# Patient Record
Sex: Male | Born: 1995 | Race: White | Hispanic: No | Marital: Married | Smoking: Former smoker
Health system: Southern US, Community
[De-identification: ages and names within clinical notes are randomized; demographics above are authoritative.]

## PROBLEM LIST (undated history)

## (undated) DIAGNOSIS — E119 Type 2 diabetes mellitus without complications: Secondary | ICD-10-CM

---
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
## 2019-02-27 ENCOUNTER — Emergency Department (HOSPITAL_COMMUNITY): Payer: BC Managed Care – PPO

## 2019-02-27 ENCOUNTER — Encounter (HOSPITAL_COMMUNITY): Payer: Self-pay | Admitting: Emergency Medicine

## 2019-02-27 ENCOUNTER — Other Ambulatory Visit: Payer: Self-pay

## 2019-02-27 ENCOUNTER — Emergency Department (HOSPITAL_COMMUNITY)
Admission: EM | Admit: 2019-02-27 | Discharge: 2019-02-27 | Disposition: A | Discharge status: Home / Self Care | Payer: BC Managed Care – PPO | Attending: Emergency Medicine | Admitting: Emergency Medicine

## 2019-02-27 DIAGNOSIS — N390 Urinary tract infection, site not specified: Secondary | ICD-10-CM | POA: Diagnosis not present

## 2019-02-27 DIAGNOSIS — R1032 Left lower quadrant pain: Secondary | ICD-10-CM | POA: Insufficient documentation

## 2019-02-27 DIAGNOSIS — R109 Unspecified abdominal pain: Secondary | ICD-10-CM

## 2019-02-27 DIAGNOSIS — Z87891 Personal history of nicotine dependence: Secondary | ICD-10-CM | POA: Diagnosis not present

## 2019-02-27 LAB — URINALYSIS, ROUTINE W REFLEX MICROSCOPIC
Bilirubin Urine: NEGATIVE
Glucose, UA: >=500 mg/dL — AB
Hgb urine dipstick: NEGATIVE
Ketones, ur: NEGATIVE mg/dL
Leukocytes,Ua: NEGATIVE
Nitrite: NEGATIVE
Protein, ur: NEGATIVE mg/dL
Specific Gravity, Urine: 1.022 (ref 1.005–1.030)
pH: 5 (ref 5.0–8.0)

## 2019-02-27 LAB — CBG MONITORING, ED: Glucose-Capillary: 237 mg/dL — ABNORMAL HIGH (ref 70–99)

## 2019-02-27 MED ORDER — AZITHROMYCIN 250 MG PO TABS
1000.0000 mg | ORAL_TABLET | Freq: Once | ORAL | Status: AC
  Administered 2019-02-27: 16:00:00 1000 mg via ORAL
  Filled 2019-02-27: qty 4

## 2019-02-27 MED ORDER — CEFTRIAXONE SODIUM 250 MG IJ SOLR
250.0000 mg | Freq: Once | INTRAMUSCULAR | Status: AC
  Administered 2019-02-27: 250 mg via INTRAMUSCULAR
  Filled 2019-02-27: qty 250

## 2019-02-27 NOTE — ED Triage Notes (Signed)
Pt reports having some vague urinary burning a few days ago followed by left flank pain which increased today with nausea. Took some Excedrin with some relief.

## 2019-02-27 NOTE — ED Provider Notes (Signed)
[facility] EMERGENCY DEPARTMENT Provider Note   CSN: [id number] Arrival date & time: 02/27/19  1310     History   Chief Complaint Chief Complaint  Patient presents with   Flank Pain    HPI Deavion Dobbs is a 23 y.o. male.     HPI   Lynard Postlewait is a 23 y.o. male who presents to the Emergency Department complaining of urinary pressure post void for 4 days.  2 days ago, he noticed pain to his left flank area for most of the day.  The following day the pain had resolved.  He continues to have urinary pressure.  He took some over-the-counter pain relievers with some improvement.  Today, he states while driving the flank pain became much worse and is now associated with nausea. He also endorses urinary frequency for 2-3 days.  Pain is been constant since onset today.  No history of kidney stones, no hematuria.  He denies vomiting or abdominal pain. He is sexually active, denies new sexual partners, penile discharge, or pain to his testicles.    History reviewed. No pertinent past medical history.  There are no active problems to display for this patient.   History reviewed. No pertinent surgical history.    Home Medications    Prior to Admission medications   Not on File    Family History History reviewed. No pertinent family history.  Social History Social History   Tobacco Use   Smoking status: Former Smoker   Smokeless tobacco: Never Used  Substance Use Topics   Alcohol use: Yes    Comment: several times weekly   Drug use: Never     Allergies   Patient has no known allergies.   Review of Systems Review of Systems  Constitutional: Negative for activity change, appetite change, chills and fever.  Respiratory: Negative for chest tightness and shortness of breath.   Cardiovascular: Negative for chest pain.  Gastrointestinal: Positive for nausea. Negative for abdominal pain and vomiting.  Genitourinary: Positive for flank pain, frequency and urgency.  Negative for decreased urine volume, difficulty urinating, discharge, dysuria, hematuria, penile swelling, scrotal swelling and testicular pain.  Musculoskeletal: Negative for back pain.  Skin: Negative for rash.  Neurological: Negative for dizziness, weakness and numbness.  Hematological: Negative for adenopathy.  Psychiatric/Behavioral: Negative for confusion.     Physical Exam Updated Vital Signs BP (!) 153/92 (BP Location: Right Arm)    Pulse 78    Temp 98 F (36.7 C) (Oral)    Resp 16    Ht 5\' 11"  (1.803 m)    Wt 120.2 kg    SpO2 99%    BMI 36.96 kg/m   Physical Exam Vitals signs and nursing note reviewed.  Constitutional:      General: He is not in acute distress.    Appearance: Normal appearance. He is not ill-appearing.  HENT:     Mouth/Throat:     Mouth: Mucous membranes are moist.  Cardiovascular:     Rate and Rhythm: Normal rate and regular rhythm.     Pulses: Normal pulses.  Pulmonary:     Effort: Pulmonary effort is normal.     Breath sounds: Normal breath sounds.  Abdominal:     Palpations: Abdomen is soft. There is no mass.     Tenderness: There is no abdominal tenderness. There is no right CVA tenderness, left CVA tenderness or guarding.  Musculoskeletal: Normal range of motion.        General: No tenderness.  Skin:  General: Skin is warm.     Findings: No rash.  Neurological:     General: No focal deficit present.     Mental Status: He is alert.     Sensory: No sensory deficit.     Motor: No weakness.      ED Treatments / Results  Labs (all labs ordered are listed, but only abnormal results are displayed) Labs Reviewed  URINALYSIS, ROUTINE W REFLEX MICROSCOPIC - Abnormal; Notable for the following components:      Result Value   Color, Urine STRAW (*)    Glucose, UA >=500 (*)    Bacteria, UA RARE (*)    All other components within normal limits  CBG MONITORING, ED - Abnormal; Notable for the following components:   Glucose-Capillary 237 (*)     All other components within normal limits  URINE CULTURE  GC/CHLAMYDIA PROBE AMP ([place]) NOT AT [facility]    EKG None  Radiology Ct Renal Stone Study  Result Date: 02/27/2019 CLINICAL DATA:  Pt reports having some vague urinary burning a few days ago followed by left flank pain which increased today with nausea. Took some Excedrin with some relief. STANDARD FULL STONE PROTOCOL USED EXAM: CT ABDOMEN AND PELVIS WITHOUT CONTRAST TECHNIQUE: Multidetector CT imaging of the abdomen and pelvis was performed following the standard protocol without IV contrast. COMPARISON:  None. FINDINGS: Lower chest: No acute abnormality. Evaluation of the abdominal organs is somewhat limited by the lack of IV contrast. Hepatobiliary: The liver enlarged with diffuse fatty infiltration. No focal liver abnormality is seen. No gallstones, gallbladder wall thickening, or biliary dilatation. Pancreas: Unremarkable. No pancreatic ductal dilatation or surrounding inflammatory changes. Spleen: Normal in size without focal abnormality. Adrenals/Urinary Tract: Adrenal glands are unremarkable. 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 No renal calculi identified. No hydronephrosis. Bladder is unremarkable. Stomach/Bowel: Stomach is within normal limits. Appendix appears normal. No evidence of bowel wall thickening, distention, or inflammatory changes. Scattered colonic diverticula. Vascular/Lymphatic: No significant vascular findings are present. No enlarged abdominal or pelvic lymph nodes. Reproductive: Prostate is unremarkable. Other: No abdominal wall hernia or abnormality. No abdominopelvic ascites. Musculoskeletal: No acute or significant osseous findings. IMPRESSION: 1. No evidence of renal calculi or other CT finding to explain patient's abdominal pain. 2.  Enlarged liver with diffuse fatty infiltration. Electronically Signed   By: Emmaline KluverNancy  Ballantyne M.D.   On: 02/27/2019 15:26     Procedures Procedures (including critical care time)  Medications Ordered in ED Medications - No data to display   Initial Impression / Assessment and Plan / ED Course  I have reviewed the triage vital signs and the nursing notes.  Pertinent labs & imaging results that were available during my care of the patient were reviewed by me and considered in my medical decision making (see chart for details).       Patient with left flank pain and dysuria.  He is well-appearing and nontoxic.  Describes pain is minimal at present.  CT scan shows no evidence of renal calculi.  He does describe some dysuria and given age and the fact that he is sexually active, I will obtain urine, GC and chlamydia cultures and treat here with IM Rocephin and p.o. Zithromax.  He understands that he will be contacted and additional prescriptions will be written if urine cultures warrant  CBG here was 237.  Patient does admit to drinking a soda and eating fast food prior to arrival.  I have  recommended the patient contact 1 of the local providers to establish primary care as his blood sugar will need to be rechecked   Return precautions were discussed  Final Clinical Impressions(s) / ED Diagnoses   Final diagnoses:  Left flank pain  Urinary tract infection without hematuria, site unspecified    ED Discharge Orders    None       Pauline Ausriplett, Nayda Riesen, PA-C 02/27/19 1612    Bethann BerkshireZammit, Joseph, MD 02/27/19 1614

## 2019-02-27 NOTE — Discharge Instructions (Signed)
Drink plenty of water.  You will be contacted if any of your culture results are positive.  You may contact 1 of the providers listed to establish primary care.  Return to the ER for any worsening symptoms such as increasing pain, fever, or abdominal pain or vomiting.   [facility] Primary Care Doctor List    Sinda Du MD. Specialty: Pulmonary Disease Contact information: [place]  [place] [address]  [phone]   Tula Nakayama, MD. Specialty: [facility] Medicine Contact information: [address] [place] [address]  [phone]   Sallee Lange, MD. Specialty: [facility] Medicine Contact information: [place]  [place] [address]  [phone]   Rosita Fire, MD Specialty: Internal Medicine Contact information: [place] [address]  [phone]   Delphina Cahill, MD. Specialty: Internal Medicine Contact information: [place] [address]  [phone]    [facility] (Dr. Maudie Mercury) Specialty: Family Medicine Contact information: [place] [address]  [phone]   Leslie Andrea, MD. Specialty: [facility] Medicine Contact information: [place] [address]  [phone]   Asencion Noble, MD. Specialty: Internal Medicine Contact information: [address]  [place] [address]  [place]  [address] [place], [place] [address] [phone]  Services [facility] offers a variety of basic health services.  Services include but are not limited to: Blood pressure checks  Heart rate checks  Blood sugar checks  Urine analysis  Rapid strep tests  Pregnancy tests.  Health education and referrals  People needing more complex services will be directed to a physician online. Using these virtual visits, doctors can  evaluate and prescribe medicine and treatments. There will be no medication on-site, though [facility] will help patients fill their prescriptions at little to no cost.   For More information please go to: [email or web address]

## 2019-03-01 LAB — GC/CHLAMYDIA PROBE AMP (~~LOC~~) NOT AT ARMC
Chlamydia: NEGATIVE
Neisseria Gonorrhea: NEGATIVE

## 2021-01-12 IMAGING — CT CT RENAL STONE PROTOCOL
2 of 4 series · 17 of 46 positions shown, 19 images · non-contrast
Comparison: None.

CLINICAL DATA: Pt reports having some vague urinary burning a few
days ago followed by left flank pain which increased today with
nausea. Took some Excedrin with some relief. STANDARD FULL STONE
PROTOCOL USED

EXAM:
CT ABDOMEN AND PELVIS WITHOUT CONTRAST
TECHNIQUE: Multidetector CT imaging of the abdomen and pelvis was performed
following the standard protocol without IV contrast.

[Series 2: axial st · axial · 0.87mm/px · z∈[-364,+56]mm · 14 of 97 slices shown, 16 images]
[im 7/97  soft-tissue]
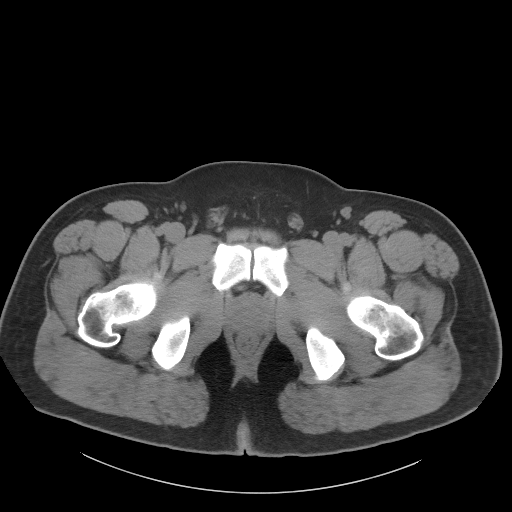
[im 7/97  bone]
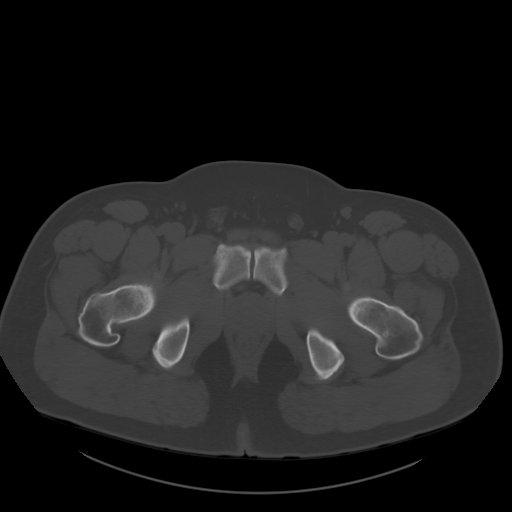
[im 13/97  soft-tissue]
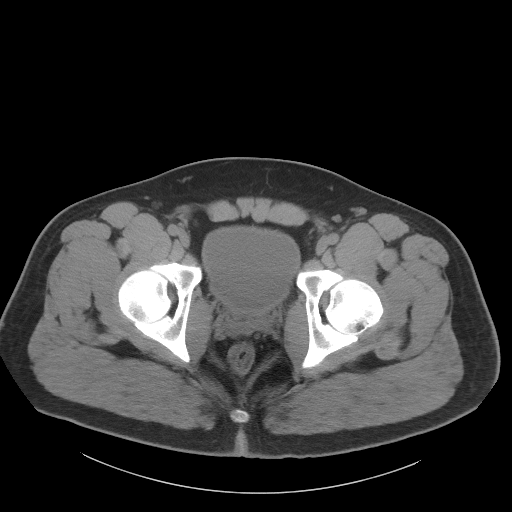
[im 19/97  soft-tissue]
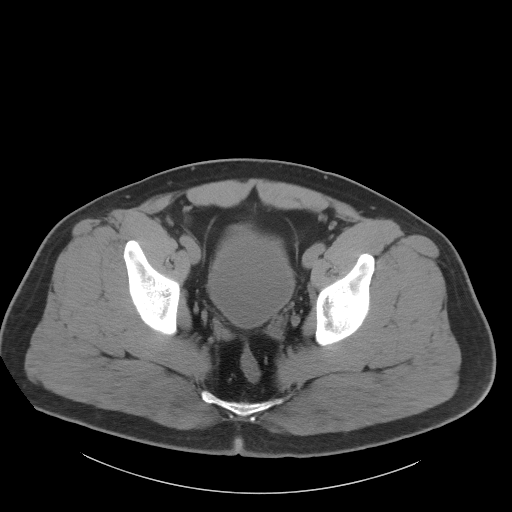
[im 25/97  soft-tissue]
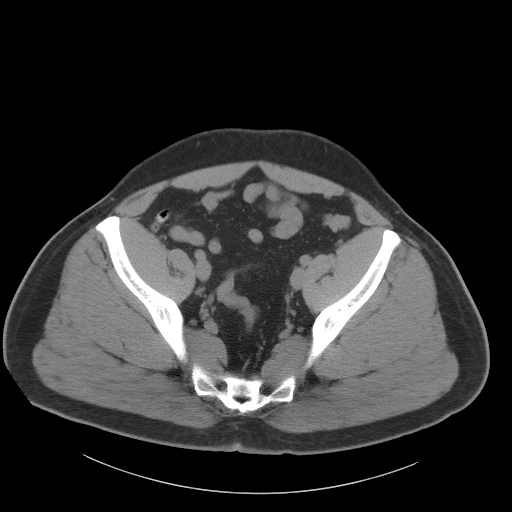
[im 31/97  soft-tissue]
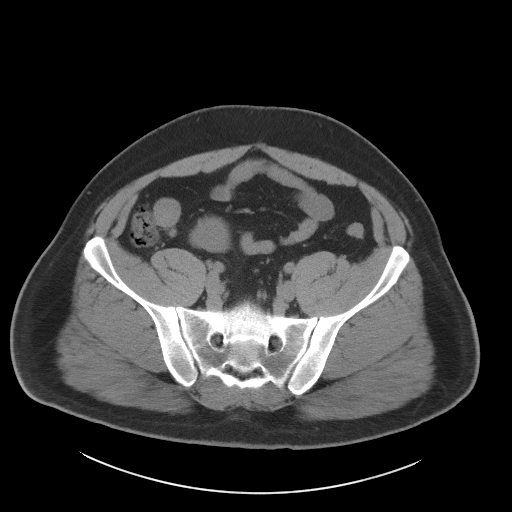
[im 37/97  soft-tissue]
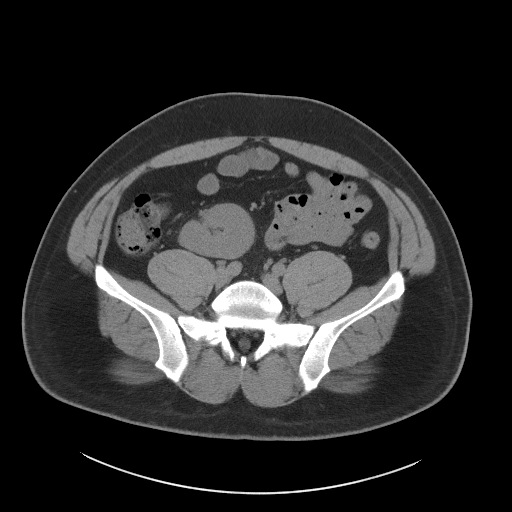
[im 43/97  soft-tissue]
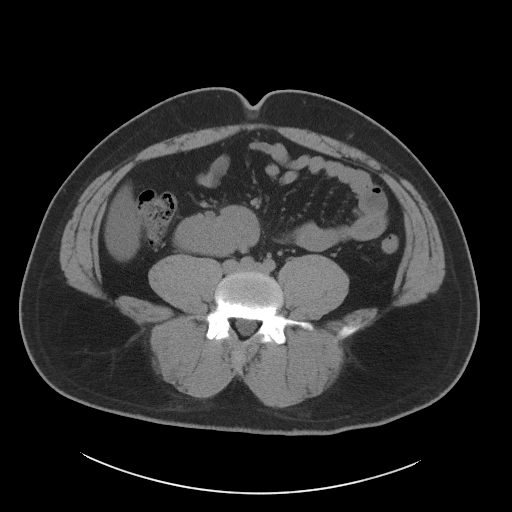
[im 55/97  soft-tissue]
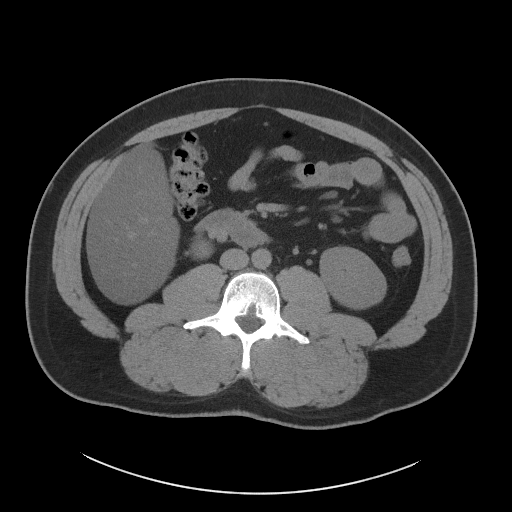
[im 61/97  soft-tissue]
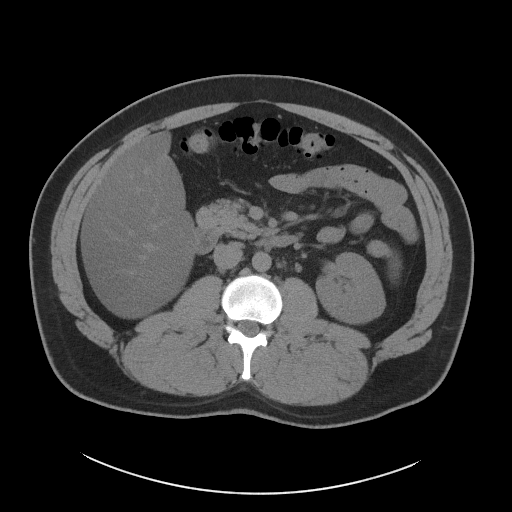
[im 61/97  bone]
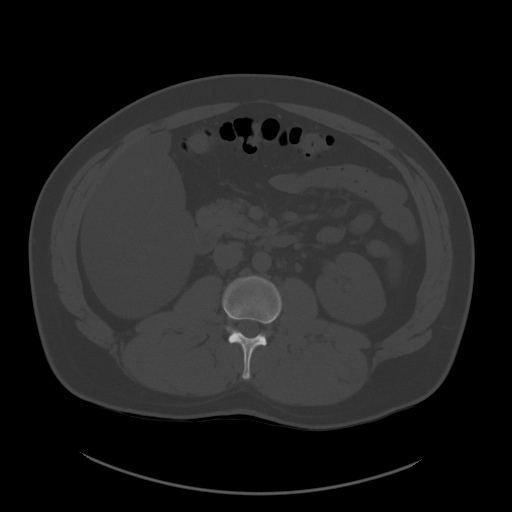
[im 67/97  soft-tissue]
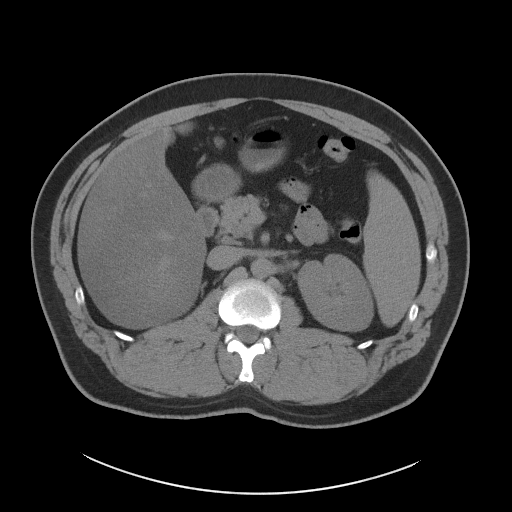
[im 73/97  soft-tissue]
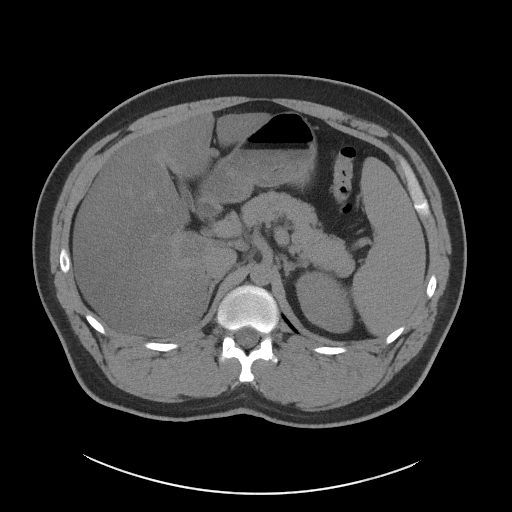
[im 79/97  soft-tissue]
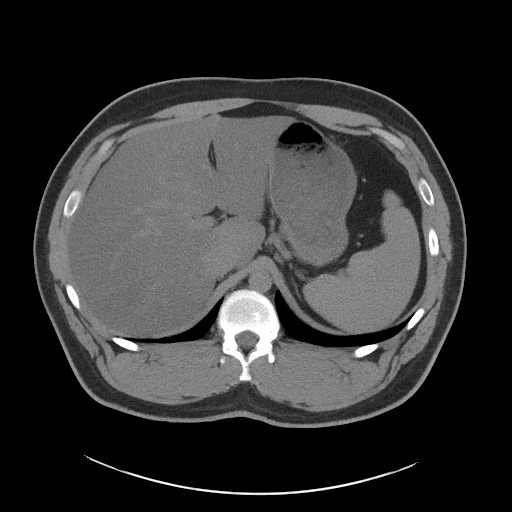
[im 85/97  soft-tissue]
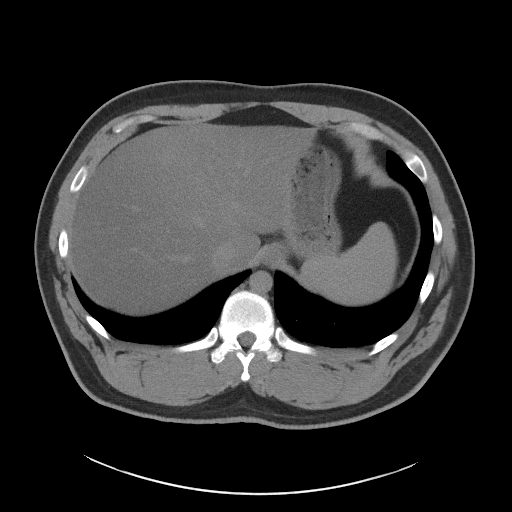
[im 91/97  soft-tissue]
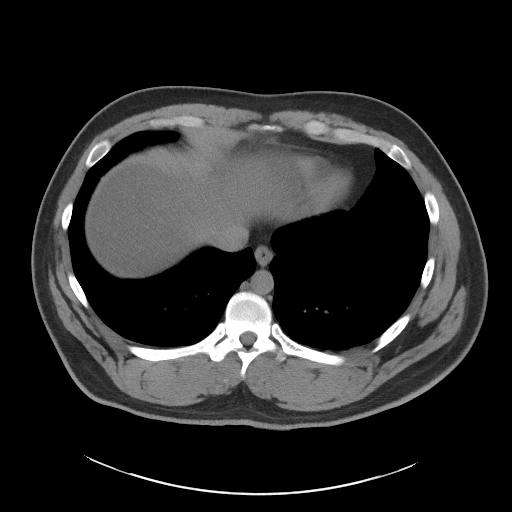

[Series 5: coronal st · coronal · 0.88mm/px · 3 of 116 slices shown]
[im 39/116  soft-tissue]
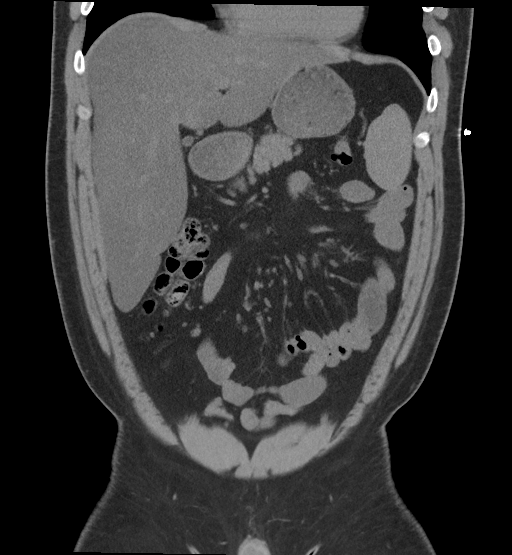
[im 52/116  soft-tissue]
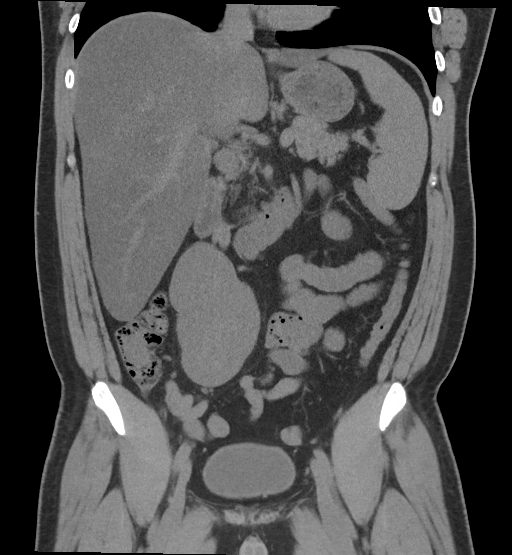
[im 64/116  soft-tissue]
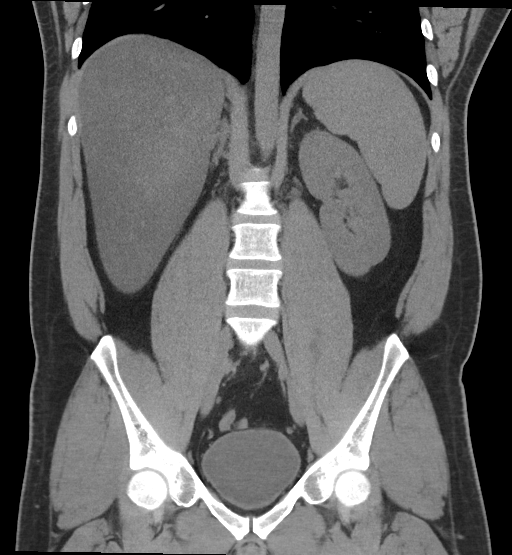

[17 of 46 positions shown; findings below may reference images not displayed]

FINDINGS: Lower chest: No acute abnormality.

Evaluation of the abdominal organs is somewhat limited by the lack
of IV contrast.

Hepatobiliary: The liver enlarged with diffuse fatty infiltration.
No focal liver abnormality is seen. No gallstones, gallbladder wall
thickening, or biliary dilatation.

Pancreas: Unremarkable. No pancreatic ductal dilatation or
surrounding inflammatory changes.

Spleen: Normal in size without focal abnormality.

Adrenals/Urinary Tract: Adrenal glands are unremarkable. Low lying
right kidney with likely partial duplication of the collecting
systems. No renal calculi identified. No hydronephrosis. Bladder is
unremarkable.

Stomach/Bowel: Stomach is within normal limits. Appendix appears
normal. No evidence of bowel wall thickening, distention, or
inflammatory changes. Scattered colonic diverticula.

Vascular/Lymphatic: No significant vascular findings are present. No
enlarged abdominal or pelvic lymph nodes.

Reproductive: Prostate is unremarkable.

Other: No abdominal wall hernia or abnormality. No abdominopelvic
ascites.

Musculoskeletal: No acute or significant osseous findings.
IMPRESSION: 1. No evidence of renal calculi or other CT finding to explain
patient's abdominal pain.

2.  Enlarged liver with diffuse fatty infiltration.

## 2022-04-16 ENCOUNTER — Ambulatory Visit (INDEPENDENT_AMBULATORY_CARE_PROVIDER_SITE_OTHER): Payer: BC Managed Care – PPO | Admitting: Family Medicine

## 2022-04-16 VITALS — BP 120/75 | HR 96 | Temp 97.5°F | Ht 71.0 in | Wt 224.0 lb

## 2022-04-16 DIAGNOSIS — Z1159 Encounter for screening for other viral diseases: Secondary | ICD-10-CM

## 2022-04-16 DIAGNOSIS — R739 Hyperglycemia, unspecified: Secondary | ICD-10-CM | POA: Diagnosis not present

## 2022-04-16 DIAGNOSIS — Z23 Encounter for immunization: Secondary | ICD-10-CM

## 2022-04-16 DIAGNOSIS — Z0000 Encounter for general adult medical examination without abnormal findings: Secondary | ICD-10-CM | POA: Insufficient documentation

## 2022-04-16 DIAGNOSIS — Z13220 Encounter for screening for lipoid disorders: Secondary | ICD-10-CM | POA: Diagnosis not present

## 2022-04-16 DIAGNOSIS — Z114 Encounter for screening for human immunodeficiency virus [HIV]: Secondary | ICD-10-CM

## 2022-04-16 DIAGNOSIS — Z130 Encounter for screening for diseases of the blood and blood-forming organs and certain disorders involving the immune mechanism: Secondary | ICD-10-CM | POA: Diagnosis not present

## 2022-04-16 DIAGNOSIS — Z8349 Family history of other endocrine, nutritional and metabolic diseases: Secondary | ICD-10-CM

## 2022-04-16 NOTE — Patient Instructions (Signed)
Labs today.  Regular exercise. Healthy diet.   Follow up annually or sooner if needed.  Take care  Dr. Lacinda Axon

## 2022-04-16 NOTE — Progress Notes (Signed)
Subjective:  Patient ID: [place] P[place], male    DOB: Nov 11, 1995  Age: 26 y.o. MRN: [id number]  CC: Chief Complaint  Patient presents with   Establish Care    No pcp since a teenager, no concerns    HPI:  26 year old male presents to establish care.  Patient states that he is doing well.  He has no particular complaints or concerns at this time.  He states that it is time to establish care with a primary doctor to set a good example for his family.  Former smoker.  Used to drink heavily as well.  He is given both of these up.  He has also lost a tremendous amount of weight.  He has lost over 100 pounds.  In 2020 he was seen in the ER.  This encounter was reviewed.  He was found to have glucose in his urine and also a capillary glucose of 237.  No formal diagnosis of diabetes.  Endorses a family history of cancer - Thyroid cancer (father), Mother (breast, cervical cancer).  Discussed preventative health care today.  He is amenable to flu vaccine.  Desires HIV and hepatitis C screening.  We will do labs today.  Advised that he can get the COVID-vaccine at his pharmacy if he desires.  Social Hx   Social History   Socioeconomic History   Marital status: Single    Spouse name: Not on file   Number of children: Not on file   Years of education: Not on file   Highest education level: Not on file  Occupational History   Not on file  Tobacco Use   Smoking status: Former   Smokeless tobacco: Never  Vaping Use   Vaping Use: Every day   Substances: Nicotine, Flavoring, Nicotine-salt  Substance and Sexual Activity   Alcohol use: Yes    Comment: several times weekly   Drug use: Never   Sexual activity: Not on file  Other Topics Concern   Not on file  Social History Narrative   Not on file   Social Determinants of Health   Financial Resource Strain: Not on file  Food Insecurity: Not on file  Transportation Needs: Not on file  Physical Activity: Not on file  Stress: Not on  file  Social Connections: Not on file    Review of Systems  Constitutional: Negative.   Respiratory: Negative.    Cardiovascular: Negative.    Objective:  BP 120/75   Pulse 96   Temp (!) 97.5 F (36.4 C)   Ht 5\' 11"  (1.803 m)   Wt 224 lb (101.6 kg)   SpO2 96%   BMI 31.24 kg/m      04/16/2022    1:05 PM 02/27/2019    4:03 PM 02/27/2019    1:17 PM  BP/Weight  Systolic BP 120 141 153  Diastolic BP 75 97 92  Wt. (Lbs) 224  265  BMI 31.24 kg/m2  36.96 kg/m2    Physical Exam Vitals and nursing note reviewed.  Constitutional:      General: He is not in acute distress.    Appearance: Normal appearance.  HENT:     Head: Normocephalic and atraumatic.     Right Ear: Tympanic membrane normal.     Left Ear: Tympanic membrane normal.     Mouth/Throat:     Pharynx: Oropharynx is clear. No oropharyngeal exudate.  Eyes:     General:        Right eye: No discharge.  Left eye: No discharge.     Conjunctiva/sclera: Conjunctivae normal.  Cardiovascular:     Rate and Rhythm: Normal rate and regular rhythm.  Pulmonary:     Effort: Pulmonary effort is normal.     Breath sounds: Normal breath sounds. No wheezing, rhonchi or rales.  Musculoskeletal:     Cervical back: Neck supple.  Lymphadenopathy:     Cervical: No cervical adenopathy.  Skin:    General: Skin is warm.     Findings: No rash.  Neurological:     General: No focal deficit present.     Mental Status: He is alert.  Psychiatric:        Mood and Affect: Mood normal.        Behavior: Behavior normal.      Assessment & Plan:   Problem List Items Addressed This Visit       Other   Annual physical exam - Primary    Laboratory noticed today including HIV and hepatitis C. Flu vaccine given.  Advised to get COVID-vaccine in his pharmacy if he desires. Advised healthy diet and regular exercise. Awaiting laboratory studies.      Other Visit Diagnoses     Screening for deficiency anemia       Relevant  Orders   CBC   Blood glucose elevated       Relevant Orders   CMP14+EGFR   Hemoglobin A1c   Screening, lipid       Relevant Orders   Lipid panel   Family history of thyroid disease       Relevant Orders   TSH   Need for hepatitis C screening test       Relevant Orders   Hepatitis C Antibody   Screening for HIV (human immunodeficiency virus)       Relevant Orders   HIV antibody (with reflex)      Follow-up:  Annually; sooner if abnormal lab results.  [place]

## 2022-04-16 NOTE — Assessment & Plan Note (Signed)
Laboratory noticed today including HIV and hepatitis C. Flu vaccine given.  Advised to get COVID-vaccine in his pharmacy if he desires. Advised healthy diet and regular exercise. Awaiting laboratory studies.

## 2022-04-17 LAB — CMP14+EGFR
ALT: 58 IU/L — ABNORMAL HIGH (ref 0–44)
AST: 26 IU/L (ref 0–40)
Albumin/Globulin Ratio: 2 (ref 1.2–2.2)
Albumin: 4.9 g/dL (ref 4.3–5.2)
Alkaline Phosphatase: 96 IU/L (ref 44–121)
BUN/Creatinine Ratio: 11 (ref 9–20)
BUN: 9 mg/dL (ref 6–20)
Bilirubin Total: 0.6 mg/dL (ref 0.0–1.2)
CO2: 23 mmol/L (ref 20–29)
Calcium: 9.9 mg/dL (ref 8.7–10.2)
Chloride: 97 mmol/L (ref 96–106)
Creatinine, Ser: 0.84 mg/dL (ref 0.76–1.27)
Globulin, Total: 2.5 g/dL (ref 1.5–4.5)
Glucose: 392 mg/dL — ABNORMAL HIGH (ref 70–99)
Potassium: 4.6 mmol/L (ref 3.5–5.2)
Sodium: 136 mmol/L (ref 134–144)
Total Protein: 7.4 g/dL (ref 6.0–8.5)
eGFR: 122 mL/min/{1.73_m2} (ref >59)

## 2022-04-17 LAB — HIV ANTIBODY (ROUTINE TESTING W REFLEX): HIV Screen 4th Generation wRfx: NONREACTIVE

## 2022-04-17 LAB — LIPID PANEL
Chol/HDL Ratio: 3.7 ratio (ref 0.0–5.0)
Cholesterol, Total: 180 mg/dL (ref 100–199)
HDL: 49 mg/dL (ref >39)
LDL Chol Calc (NIH): 111 mg/dL — ABNORMAL HIGH (ref 0–99)
Triglycerides: 111 mg/dL (ref 0–149)
VLDL Cholesterol Cal: 20 mg/dL (ref 5–40)

## 2022-04-17 LAB — CBC
Hematocrit: 46.8 % (ref 37.5–51.0)
Hemoglobin: 16.4 g/dL (ref 13.0–17.7)
MCH: 33 pg (ref 26.6–33.0)
MCHC: 35 g/dL (ref 31.5–35.7)
MCV: 94 fL (ref 79–97)
Platelets: 343 10*3/uL (ref 150–450)
RBC: 4.97 x10E6/uL (ref 4.14–5.80)
RDW: 12 % (ref 11.6–15.4)
WBC: 7 10*3/uL (ref 3.4–10.8)

## 2022-04-17 LAB — TSH: TSH: 1.01 u[IU]/mL (ref 0.450–4.500)

## 2022-04-17 LAB — HEPATITIS C ANTIBODY: Hep C Virus Ab: NONREACTIVE

## 2022-04-17 LAB — HEMOGLOBIN A1C
Est. average glucose Bld gHb Est-mCnc: 240 mg/dL
Hgb A1c MFr Bld: 10 % — ABNORMAL HIGH (ref 4.8–5.6)

## 2022-04-20 ENCOUNTER — Other Ambulatory Visit: Payer: Self-pay | Admitting: Family Medicine

## 2022-04-20 MED ORDER — BLOOD GLUCOSE MONITOR KIT: PACK | 0 refills | Status: DC

## 2022-04-20 MED ORDER — METFORMIN HCL 500 MG PO TABS: 500.0000 mg | ORAL_TABLET | Freq: Two times a day (BID) | ORAL | 3 refills | Status: DC

## 2022-04-27 ENCOUNTER — Other Ambulatory Visit: Payer: Self-pay | Admitting: Family Medicine

## 2022-04-27 MED ORDER — ACCU-CHEK SOFTCLIX LANCETS MISC: 12 refills | Status: DC

## 2022-04-27 MED ORDER — ACCU-CHEK GUIDE VI STRP: ORAL_STRIP | 12 refills | Status: DC

## 2023-03-16 ENCOUNTER — Encounter: Payer: Self-pay | Admitting: Family Medicine

## 2023-03-16 ENCOUNTER — Ambulatory Visit (INDEPENDENT_AMBULATORY_CARE_PROVIDER_SITE_OTHER): Payer: BC Managed Care – PPO | Admitting: Family Medicine

## 2023-03-16 VITALS — BP 113/74 | HR 71 | Temp 98.1°F | Wt 223.4 lb

## 2023-03-16 DIAGNOSIS — R070 Pain in throat: Secondary | ICD-10-CM

## 2023-03-16 DIAGNOSIS — E7800 Pure hypercholesterolemia, unspecified: Secondary | ICD-10-CM

## 2023-03-16 DIAGNOSIS — E1165 Type 2 diabetes mellitus with hyperglycemia: Secondary | ICD-10-CM | POA: Diagnosis not present

## 2023-03-16 DIAGNOSIS — E785 Hyperlipidemia, unspecified: Secondary | ICD-10-CM | POA: Insufficient documentation

## 2023-03-16 NOTE — Patient Instructions (Signed)
Referring to ENT.  Labs and urine today.

## 2023-03-16 NOTE — Progress Notes (Signed)
Subjective:  Patient ID: Steven Santiago, male    DOB: 1995/09/10  Age: 27 y.o. MRN: [id number]  CC: Throat issue   HPI:  27 year old male presents for evaluation of the above.  Patient reports a 60-month history of an odd sensation in his throat.  He states that he feels like it catches or grinds.  No significant pain or discomfort when swallowing although if he leans forward with his neck flexed he can have some difficulty swallowing.  No difficulty breathing.  No hoarseness.  No other associated symptoms.  Patient would like recheck of A1c.  Previously patient found to have uncontrolled diabetes with an A1c of 10.  He has made lifestyle changes.  I prescribed metformin but he has not taken this medication.  Patient Active Problem List   Diagnosis Date Noted   Uncontrolled type 2 diabetes mellitus with hyperglycemia (HCC) 03/16/2023   Hyperlipidemia 03/16/2023   Throat discomfort 03/16/2023   Annual physical exam 04/16/2022    Social Hx   Social History   Socioeconomic History   Marital status: Single    Spouse name: Not on file   Number of children: Not on file   Years of education: Not on file   Highest education level: Not on file  Occupational History   Not on file  Tobacco Use   Smoking status: Former   Smokeless tobacco: Never  Vaping Use   Vaping status: Every Day   Substances: Nicotine, Flavoring, Nicotine-salt  Substance and Sexual Activity   Alcohol use: Yes    Comment: several times weekly   Drug use: Never   Sexual activity: Not on file  Other Topics Concern   Not on file  Social History Narrative   Not on file   Social Determinants of Health   Financial Resource Strain: Not on file  Food Insecurity: Not on file  Transportation Needs: Not on file  Physical Activity: Not on file  Stress: Not on file  Social Connections: Not on file    Review of Systems Per HPI  Objective:  BP 113/74   Pulse 71   Temp 98.1 F (36.7 C) (Oral)   Wt 223 lb  6.4 oz (101.3 kg)   SpO2 99%   BMI 31.16 kg/m      03/16/2023    2:59 PM 04/16/2022    1:05 PM 02/27/2019    4:03 PM  BP/Weight  Systolic BP 113 120 141  Diastolic BP 74 75 97  Wt. (Lbs) 223.4 224   BMI 31.16 kg/m2 31.24 kg/m2     Physical Exam Vitals and nursing note reviewed.  HENT:     Head: Normocephalic and atraumatic.     Mouth/Throat:     Comments: Oropharynx -cobblestoning noted. Cardiovascular:     Rate and Rhythm: Normal rate and regular rhythm.  Pulmonary:     Effort: Pulmonary effort is normal.     Breath sounds: Normal breath sounds. No wheezing or rales.  Musculoskeletal:     Cervical back: Neck supple. No tenderness.  Lymphadenopathy:     Cervical: No cervical adenopathy.  Neurological:     Mental Status: He is alert.  Psychiatric:        Mood and Affect: Mood normal.        Behavior: Behavior normal.     Lab Results  Component Value Date   WBC 7.0 04/16/2022   HGB 16.4 04/16/2022   HCT 46.8 04/16/2022   PLT 343 04/16/2022   GLUCOSE 392 (H) 04/16/2022  CHOL 180 04/16/2022   TRIG 111 04/16/2022   HDL 49 04/16/2022   LDLCALC 111 (H) 04/16/2022   ALT 58 (H) 04/16/2022   AST 26 04/16/2022   NA 136 04/16/2022   K 4.6 04/16/2022   CL 97 04/16/2022   CREATININE 0.84 04/16/2022   BUN 9 04/16/2022   CO2 23 04/16/2022   TSH 1.010 04/16/2022   HGBA1C 10.0 (H) 04/16/2022     Assessment & Plan:   Problem List Items Addressed This Visit       Endocrine   Uncontrolled type 2 diabetes mellitus with hyperglycemia (HCC)    Unsure of current control.  Labs today for further evaluation.      Relevant Orders   Hemoglobin A1c   Microalbumin / creatinine urine ratio     Other   Throat discomfort - Primary    Etiology unclear.  Referring to ENT.      Relevant Orders   Ambulatory referral to ENT   Hyperlipidemia   Relevant Orders   Lipid panel     Follow-up:  Has follow up scheduled.  Everlene Other DO [facility]

## 2023-03-16 NOTE — Assessment & Plan Note (Signed)
Unsure of current control.  Labs today for further evaluation.

## 2023-03-16 NOTE — Assessment & Plan Note (Signed)
Etiology unclear.  Referring to ENT.

## 2023-03-17 LAB — LIPID PANEL
Chol/HDL Ratio: 4.3 ratio (ref 0.0–5.0)
Cholesterol, Total: 203 mg/dL — ABNORMAL HIGH (ref 100–199)
HDL: 47 mg/dL (ref >39)
LDL Chol Calc (NIH): 128 mg/dL — ABNORMAL HIGH (ref 0–99)
Triglycerides: 158 mg/dL — ABNORMAL HIGH (ref 0–149)
VLDL Cholesterol Cal: 28 mg/dL (ref 5–40)

## 2023-03-17 LAB — HEMOGLOBIN A1C
Est. average glucose Bld gHb Est-mCnc: 312 mg/dL
Hgb A1c MFr Bld: 12.5 % — ABNORMAL HIGH (ref 4.8–5.6)

## 2023-03-17 LAB — MICROALBUMIN / CREATININE URINE RATIO
Creatinine, Urine: 28.8 mg/dL
Microalb/Creat Ratio: <10 mg/g{creat} (ref 0–29)
Microalbumin, Urine: <3 ug/mL

## 2023-03-18 ENCOUNTER — Other Ambulatory Visit: Payer: Self-pay

## 2023-03-18 DIAGNOSIS — E1165 Type 2 diabetes mellitus with hyperglycemia: Secondary | ICD-10-CM

## 2023-03-24 ENCOUNTER — Telehealth: Payer: Self-pay

## 2023-03-24 NOTE — Telephone Encounter (Signed)
Please send GLP1 to pharmacy

## 2023-03-25 ENCOUNTER — Other Ambulatory Visit: Payer: Self-pay | Admitting: Family Medicine

## 2023-03-25 MED ORDER — SEMAGLUTIDE(0.25 OR 0.5MG/DOS) 2 MG/3ML ~~LOC~~ SOPN: PEN_INJECTOR | SUBCUTANEOUS | 0 refills | Status: DC

## 2023-04-09 ENCOUNTER — Encounter: Payer: Self-pay | Admitting: *Deleted

## 2023-04-09 ENCOUNTER — Telehealth: Payer: Self-pay | Admitting: Pharmacist

## 2023-04-09 NOTE — Telephone Encounter (Signed)
A1C 12.5% New to practice LDL uncontrolled Will likely need titration

## 2023-04-09 NOTE — Telephone Encounter (Signed)
APPROVED-Ozempic Please let patient and pharmacy know Patient should go online to get $25 copay card (can do on your phone)

## 2023-04-12 NOTE — Telephone Encounter (Signed)
Patient notified via mychart

## 2023-04-12 NOTE — Telephone Encounter (Signed)
Coral Spikes, DO     Please inform patient.

## 2023-04-26 ENCOUNTER — Ambulatory Visit (INDEPENDENT_AMBULATORY_CARE_PROVIDER_SITE_OTHER): Payer: Commercial Managed Care - PPO | Admitting: Family Medicine

## 2023-04-26 VITALS — BP 123/86 | HR 101 | Temp 97.9°F | Ht 69.69 in | Wt 223.4 lb

## 2023-04-26 DIAGNOSIS — Z809 Family history of malignant neoplasm, unspecified: Secondary | ICD-10-CM | POA: Diagnosis not present

## 2023-04-26 DIAGNOSIS — Z0000 Encounter for general adult medical examination without abnormal findings: Secondary | ICD-10-CM

## 2023-04-26 DIAGNOSIS — Z23 Encounter for immunization: Secondary | ICD-10-CM

## 2023-04-26 NOTE — Assessment & Plan Note (Signed)
Recommended continuation of GLP-1 therapy.  He is amenable to this.  Flu vaccine given. No labs today.  He has had fairly recent labs. Referring to genetic counseling given family history of cancer.  No indications for colonoscopy at this time.

## 2023-04-26 NOTE — Progress Notes (Signed)
Subjective:  Patient ID: Steven Santiago, male    DOB: 25-Mar-1996  Age: 27 y.o. MRN: [id number]  CC:  Annual exam   HPI:  27 year old male presents for a physical.  Patient reports that overall he is doing well.  He is having some side effects from semaglutide.  He is having some abdominal discomfort and nausea.  He is currently on the starting dose.  A1c 12.5 in September.  In regards to his preventative health care, he desires his flu vaccine today.  Patient inquiring about whether he needs a colonoscopy given stool changes and family history of cancer.  He states that he has an extensive family history of cancer including thyroid cancer in his father which was at a very young age (26), colon cancer in his grandmother, mother with breast cancer will discuss referral to genetic counseling today.  Patient Active Problem List   Diagnosis Date Noted   Uncontrolled type 2 diabetes mellitus with hyperglycemia (HCC) 03/16/2023   Hyperlipidemia 03/16/2023   Throat discomfort 03/16/2023   Annual physical exam 04/16/2022    Social Hx   Social History   Socioeconomic History   Marital status: Single    Spouse name: Not on file   Number of children: Not on file   Years of education: Not on file   Highest education level: Not on file  Occupational History   Not on file  Tobacco Use   Smoking status: Former   Smokeless tobacco: Never  Vaping Use   Vaping status: Every Day   Substances: Nicotine, Flavoring, Nicotine-salt  Substance and Sexual Activity   Alcohol use: Yes    Comment: several times weekly   Drug use: Never   Sexual activity: Not on file  Other Topics Concern   Not on file  Social History Narrative   Not on file   Social Determinants of Health   Financial Resource Strain: Not on file  Food Insecurity: Not on file  Transportation Needs: Not on file  Physical Activity: Not on file  Stress: Not on file  Social Connections: Not on file    Review of Systems Per  HPI  Objective:  BP 123/86   Pulse (!) 101   Temp 97.9 F (36.6 C)   Ht 5' 9.69" (1.77 m)   Wt 223 lb 6.4 oz (101.3 kg)   SpO2 99%   BMI 32.35 kg/m      04/26/2023    1:40 PM 03/16/2023    2:59 PM 04/16/2022    1:05 PM  BP/Weight  Systolic BP 123 113 120  Diastolic BP 86 74 75  Wt. (Lbs) 223.4 223.4 224  BMI 32.35 kg/m2 31.16 kg/m2 31.24 kg/m2    Physical Exam Vitals and nursing note reviewed.  Constitutional:      General: He is not in acute distress.    Appearance: Normal appearance.  HENT:     Head: Normocephalic and atraumatic.     Right Ear: Tympanic membrane normal.     Left Ear: Tympanic membrane normal.     Nose: Nose normal.     Mouth/Throat:     Pharynx: Oropharynx is clear.  Eyes:     General:        Right eye: No discharge.        Left eye: No discharge.     Conjunctiva/sclera: Conjunctivae normal.  Cardiovascular:     Rate and Rhythm: Normal rate and regular rhythm.  Pulmonary:     Effort: Pulmonary effort is normal.  Breath sounds: Normal breath sounds. No wheezing, rhonchi or rales.  Abdominal:     General: There is no distension.     Palpations: Abdomen is soft.     Tenderness: There is no abdominal tenderness.  Musculoskeletal:     Cervical back: Neck supple. No tenderness.  Neurological:     Mental Status: He is alert.  Psychiatric:        Mood and Affect: Mood normal.        Behavior: Behavior normal.     Lab Results  Component Value Date   WBC 7.0 04/16/2022   HGB 16.4 04/16/2022   HCT 46.8 04/16/2022   PLT 343 04/16/2022   GLUCOSE 392 (H) 04/16/2022   CHOL 203 (H) 03/16/2023   TRIG 158 (H) 03/16/2023   HDL 47 03/16/2023   LDLCALC 128 (H) 03/16/2023   ALT 58 (H) 04/16/2022   AST 26 04/16/2022   NA 136 04/16/2022   K 4.6 04/16/2022   CL 97 04/16/2022   CREATININE 0.84 04/16/2022   BUN 9 04/16/2022   CO2 23 04/16/2022   TSH 1.010 04/16/2022   HGBA1C 12.5 (H) 03/16/2023     Assessment & Plan:   Problem List  Items Addressed This Visit       Other   Annual physical exam - Primary    Recommended continuation of GLP-1 therapy.  He is amenable to this.  Flu vaccine given. No labs today.  He has had fairly recent labs. Referring to genetic counseling given family history of cancer.  No indications for colonoscopy at this time.      Other Visit Diagnoses     Immunization due       Relevant Orders   Flu vaccine trivalent PF, 6mos and older(Flulaval,Afluria,Fluarix,Fluzone) (Completed)   Family history of cancer       Relevant Orders   Ambulatory referral to Genetics      Follow-up:  Return in about 3 months (around 07/27/2023) for Diabetes follow up.  Everlene Other DO [facility]

## 2023-04-26 NOTE — Patient Instructions (Addendum)
Referral placed.  Continue your medication. If you feel that you are not tolerating well please let me know.  Follow up in 3 months.

## 2023-04-29 NOTE — Telephone Encounter (Signed)
Scheduled appointments per referral. Patient is aware of the appointment time and date as well as the address. Patient was informed to arrive 10-15 minutes prior with updated insurance information. All questions were answered.

## 2023-05-26 ENCOUNTER — Telehealth: Payer: Self-pay | Admitting: Genetic Counselor

## 2023-05-26 NOTE — Telephone Encounter (Signed)
Rescheduled appointment per patients request. Patient is aware of the changes made to his upcoming appointments.

## 2023-05-27 ENCOUNTER — Inpatient Hospital Stay: Payer: Commercial Managed Care - PPO

## 2023-05-27 ENCOUNTER — Inpatient Hospital Stay: Payer: Commercial Managed Care - PPO | Admitting: Genetic Counselor

## 2023-06-16 ENCOUNTER — Encounter: Payer: Self-pay | Admitting: Genetic Counselor

## 2023-06-17 ENCOUNTER — Other Ambulatory Visit: Payer: Self-pay | Admitting: Family Medicine

## 2023-06-17 ENCOUNTER — Inpatient Hospital Stay: Payer: Commercial Managed Care - PPO | Attending: Internal Medicine | Admitting: Genetic Counselor

## 2023-06-17 ENCOUNTER — Inpatient Hospital Stay: Payer: Commercial Managed Care - PPO

## 2023-06-17 DIAGNOSIS — Z803 Family history of malignant neoplasm of breast: Secondary | ICD-10-CM | POA: Diagnosis not present

## 2023-06-17 DIAGNOSIS — Z800 Family history of malignant neoplasm of digestive organs: Secondary | ICD-10-CM | POA: Diagnosis not present

## 2023-06-17 DIAGNOSIS — Z808 Family history of malignant neoplasm of other organs or systems: Secondary | ICD-10-CM

## 2023-06-18 ENCOUNTER — Encounter: Payer: Self-pay | Admitting: Genetic Counselor

## 2023-06-18 MED ORDER — SEMAGLUTIDE(0.25 OR 0.5MG/DOS) 2 MG/3ML ~~LOC~~ SOPN: PEN_INJECTOR | SUBCUTANEOUS | 0 refills | Status: DC

## 2023-06-18 NOTE — Progress Notes (Signed)
REFERRING PROVIDER: Tommie Sams, DO [address] [place],  [place] [address]  PRIMARY PROVIDER:  Tommie Sams, DO  PRIMARY REASON FOR VISIT:  1. Family history of breast cancer   2. Family history of pancreatic cancer   3. Family history of thyroid cancer    HISTORY OF PRESENT ILLNESS:   Steven Santiago, a 27 y.o. male, was seen for a [place] cancer genetics consultation at the request of Dr. Adriana Simas due to a family history of cancer.  Steven Santiago presents to clinic today to discuss the possibility of a hereditary predisposition to cancer, to discuss genetic testing, and to further clarify his future cancer risks, as well as potential cancer risks for family members.   Steven Santiago is a 27 y.o. male with no personal history of cancer.     Social History   Socioeconomic History   Marital status: Married    Spouse name: Not on file   Number of children: Not on file   Years of education: Not on file   Highest education level: Not on file  Occupational History   Not on file  Tobacco Use   Smoking status: Former   Smokeless tobacco: Never  Vaping Use   Vaping status: Every Day   Substances: Nicotine, Flavoring, Nicotine-salt  Substance and Sexual Activity   Alcohol use: Yes    Comment: several times weekly   Drug use: Never   Sexual activity: Not on file  Other Topics Concern   Not on file  Social History Narrative   Not on file   Social Drivers of Health   Financial Resource Strain: Not on file  Food Insecurity: Not on file  Transportation Needs: Not on file  Physical Activity: Not on file  Stress: Not on file  Social Connections: Not on file     FAMILY HISTORY:  We obtained a detailed, 4-generation family history.  Significant diagnoses are listed below: Family History  Problem Relation Age of Onset   Breast cancer Mother 38 - 77   Cervical cancer Mother 60 - 14   Thyroid cancer Father 71   Pancreatic cancer Maternal Grandmother 4   Cancer Paternal  Great-grandmother        grandmother's mother, unknown type   Cancer Cousin        paternal first cousin, unknown cancer dx. childhood      Steven Santiago mother was diagnosed with breast cancer in her late 30s/early 21s and cervical cancer in her mid 59s, she is currently 80. His maternal grandmother was diagnosed with pancreatic cancer at age 73, she died due to metastatic cancer at age 73. Steven Santiago father was diagnosed with an unknown type of thyroid cancer at age 67, he died due to metastatic thyroid cancer at age 42. One paternal first cousin died due to an unknown type of cancer as a child. His paternal great grandmother (grandmother's mother) was diagnosed with an unknown type of cancer, she is deceased. Steven Santiago is unaware of previous family history of genetic testing for hereditary cancer risks. There is no reported Ashkenazi Jewish ancestry.   GENETIC COUNSELING ASSESSMENT: Steven Santiago is a 27 y.o. male with a family history of cancer which is somewhat suggestive of a hereditary predisposition to cancer. We, therefore, discussed and recommended the following at today's visit.   DISCUSSION: We discussed that 5 - 10% of cancer is hereditary, with most cases of hereditary breast and pancreatic cancer associated with BRCA1/2.  There are  other genes that can be associated with hereditary breast and pancreatic cancer syndromes.  We discussed that testing is beneficial for several reasons, including knowing about other cancer risks, identifying potential screening and risk-reduction options that may be appropriate, and to understanding if other family members could be at risk for cancer and allowing them to undergo genetic testing.  We reviewed the characteristics, features and inheritance patterns of hereditary cancer syndromes. We also discussed genetic testing, including the appropriate family members to test, the process of testing, insurance coverage and turn-around-time for results. We  discussed the implications of a negative, positive, carrier and/or variant of uncertain significant result. We discussed that negative results would be uninformative given that Steven Santiago does not have a personal history of cancer. If his mother is unwilling to pursue testing, we recommended Steven Santiago pursue genetic testing for a panel that contains genes associated with cancer.  Based on Steven Santiago family history of cancer, he meets medical criteria for genetic testing. Though Steven Santiago is not personally affected, there are no affected family members that are willing/able to undergo hereditary cancer testing. Despite that he meets criteria, he may still have an out of pocket cost. We discussed that if his out of pocket cost for testing is over $100, the laboratory should contact them to discuss self-pay prices, patient pay assistance programs, if applicable, and other billing options.  We discussed that some people do not want to undergo genetic testing due to fear of genetic discrimination.  A federal law called the Genetic Information Non-Discrimination Act (GINA) of 2008 helps protect individuals against genetic discrimination based on their genetic test results.  It impacts both health insurance and employment.  With health insurance, it protects against increased premiums, being kicked off insurance or being forced to take a test in order to be insured.  For employment it protects against hiring, firing and promoting decisions based on genetic test results.  GINA does not apply to those in the [employer], those who work for companies with less than 15 employees, and new life insurance or long-term disability insurance policies.  Health status due to a cancer diagnosis is not protected under GINA.  PLAN: Due to the limitations of GINA, Steven Santiago did not wish to pursue genetic testing at today's visit. He plans to reach back out to [place] next year to pursue testing. We understand this decision and remain  available to coordinate genetic testing. We, therefore, recommend Steven Santiago continue to follow the cancer screening guidelines given by his primary healthcare provider.  Steven Santiago questions were answered to his satisfaction today. Our contact information was provided should additional questions or concerns arise. Thank you for the referral and allowing [place] to share in the care of your patient.   Lalla Brothers, MS, [facility] Genetic Counselor [email or web address] (P) [phone]  The patient was seen for a total of 30 minutes in face-to-face genetic counseling.  The patient was seen alone.  Drs. Pamelia Hoit and/or Mosetta Putt were available to discuss this case as needed.  _______________________________________________________________________ For Office Staff:  Number of people involved in session: 1 Was an Intern/ student involved with case: no

## 2023-06-23 ENCOUNTER — Other Ambulatory Visit: Payer: Self-pay | Admitting: Family Medicine

## 2023-06-23 ENCOUNTER — Ambulatory Visit: Payer: Commercial Managed Care - PPO | Admitting: Nurse Practitioner

## 2023-06-24 ENCOUNTER — Ambulatory Visit: Payer: Commercial Managed Care - PPO | Admitting: Nurse Practitioner

## 2023-06-24 DIAGNOSIS — E1165 Type 2 diabetes mellitus with hyperglycemia: Secondary | ICD-10-CM

## 2023-06-24 DIAGNOSIS — Z7985 Long-term (current) use of injectable non-insulin antidiabetic drugs: Secondary | ICD-10-CM

## 2023-06-24 DIAGNOSIS — E782 Mixed hyperlipidemia: Secondary | ICD-10-CM

## 2023-07-02 ENCOUNTER — Encounter: Payer: Self-pay | Admitting: Emergency Medicine

## 2023-07-02 ENCOUNTER — Ambulatory Visit
Admission: EM | Admit: 2023-07-02 | Discharge: 2023-07-02 | Disposition: A | Discharge status: Home / Self Care | Payer: Commercial Managed Care - PPO | Attending: Family Medicine | Admitting: Family Medicine

## 2023-07-02 DIAGNOSIS — K047 Periapical abscess without sinus: Secondary | ICD-10-CM

## 2023-07-02 HISTORY — DX: Type 2 diabetes mellitus without complications: E11.9

## 2023-07-02 MED ORDER — CHLORHEXIDINE GLUCONATE 0.12 % MT SOLN: 15.0000 mL | Freq: Two times a day (BID) | OROMUCOSAL | 0 refills | Status: DC

## 2023-07-02 MED ORDER — LIDOCAINE VISCOUS HCL 2 % MT SOLN: 10.0000 mL | OROMUCOSAL | 0 refills | Status: DC | PRN

## 2023-07-02 MED ORDER — AMOXICILLIN-POT CLAVULANATE 875-125 MG PO TABS: 1.0000 | ORAL_TABLET | Freq: Two times a day (BID) | ORAL | 0 refills | Status: DC

## 2023-07-02 NOTE — ED Triage Notes (Signed)
Tooth pain on left upper side x 2 weeks.

## 2023-07-02 NOTE — ED Provider Notes (Signed)
[facility]    CSN: [id number] Arrival date & time: 07/02/23  1226      History   Chief Complaint No chief complaint on file.   HPI Steven Santiago is a 27 y.o. male.   Patient presenting today with 2-week history of progressively worsening left upper dental pain, gum swelling.  States he has a history of dental issues in this area to include previous root canal with no crown and then getting the tooth broken during jujitsu.  Has been trying over-the-counter pain relievers and benzocaine cream with minimal relief.  Currently not following with a dentist and trying to find one.  Denies fever, chills, difficulty breathing or swallowing, drainage or bleeding.    Past Medical History:  Diagnosis Date   Diabetes mellitus without complication [facility])     Patient Active Problem List   Diagnosis Date Noted   Uncontrolled type 2 diabetes mellitus with hyperglycemia (HCC) 03/16/2023   Hyperlipidemia 03/16/2023   Throat discomfort 03/16/2023   Annual physical exam 04/16/2022    History reviewed. No pertinent surgical history.     Home Medications    Prior to Admission medications   Medication Sig Start Date End Date Taking? Authorizing Provider  amoxicillin-clavulanate (AUGMENTIN) 875-125 MG tablet Take 1 tablet by mouth every 12 (twelve) hours. 07/02/23  Yes Particia Nearing, PA-C  chlorhexidine (PERIDEX) 0.12 % solution Use as directed 15 mLs in the mouth or throat 2 (two) times daily. 07/02/23  Yes Particia Nearing, PA-C  lidocaine (XYLOCAINE) 2 % solution Use as directed 10 mLs in the mouth or throat every 3 (three) hours as needed. 07/02/23  Yes Particia Nearing, PA-C  Semaglutide,0.25 or 0.5MG /DOS, 2 MG/3ML SOPN 0.25 mg once weekly for 4 weeks, then increase to 0.5 mg once weekly. 06/18/23   Tommie Sams, DO    Family History Family History  Problem Relation Age of Onset   Breast cancer Mother 81 - 15   Cervical cancer Mother 71 - 29    Thyroid cancer Father 21   Pancreatic cancer Maternal Grandmother 40   Cancer Paternal Great-grandmother        grandmother's mother, unknown type   Cancer Cousin        paternal first cousin, unknown cancer dx. childhood    Social History Social History   Tobacco Use   Smoking status: Former   Smokeless tobacco: Never  Advertising account planner   Vaping status: Every Day   Substances: Nicotine, Flavoring, Nicotine-salt  Substance Use Topics   Alcohol use: Yes    Comment: several times weekly   Drug use: Never     Allergies   Patient has no known allergies.   Review of Systems Review of Systems Per HPI  Physical Exam Triage Vital Signs ED Triage Vitals  Encounter Vitals Group     BP 07/02/23 1339 114/78     Systolic BP Percentile --      Diastolic BP Percentile --      Pulse Rate 07/02/23 1339 72     Resp 07/02/23 1339 16     Temp 07/02/23 1339 98 F (36.7 C)     Temp Source 07/02/23 1339 Oral     SpO2 07/02/23 1339 97 %     Weight --      Height --      Head Circumference --      Peak Flow --      Pain Score 07/02/23 1341 6     Pain  Loc --      Pain Education --      Exclude from Growth Chart --    No data found.  Updated Vital Signs BP 114/78 (BP Location: Right Arm)   Pulse 72   Temp 98 F (36.7 C) (Oral)   Resp 16   SpO2 97%   Visual Acuity Right Eye Distance:   Left Eye Distance:   Bilateral Distance:    Right Eye Near:   Left Eye Near:    Bilateral Near:     Physical Exam Vitals and nursing note reviewed.  Constitutional:      Appearance: Normal appearance.  HENT:     Head: Atraumatic.     Mouth/Throat:     Mouth: Mucous membranes are moist.     Pharynx: Oropharynx is clear.     Comments: Gingival erythema, edema and broken molar to the left upper jaw Eyes:     Extraocular Movements: Extraocular movements intact.     Conjunctiva/sclera: Conjunctivae normal.  Cardiovascular:     Rate and Rhythm: Normal rate and regular rhythm.  Pulmonary:      Effort: Pulmonary effort is normal.     Breath sounds: Normal breath sounds.  Musculoskeletal:        General: Normal range of motion.     Cervical back: Normal range of motion and neck supple.  Skin:    General: Skin is warm and dry.  Neurological:     General: No focal deficit present.     Mental Status: He is oriented to person, place, and time.  Psychiatric:        Mood and Affect: Mood normal.        Thought Content: Thought content normal.        Judgment: Judgment normal.      UC Treatments / Results  Labs (all labs ordered are listed, but only abnormal results are displayed) Labs Reviewed - No data to display  EKG   Radiology No results found.  Procedures Procedures (including critical care time)  Medications Ordered in UC Medications - No data to display  Initial Impression / Assessment and Plan / UC Course  I have reviewed the triage vital signs and the nursing notes.  Pertinent labs & imaging results that were available during my care of the patient were reviewed by me and considered in my medical decision making (see chart for details).     Treat with Augmentin, Peridex, viscous lidocaine, close dental follow-up.  Return for worsening symptoms.  Over-the-counter pain relievers recommended. Final Clinical Impressions(s) / UC Diagnoses   Final diagnoses:  Dental infection   Discharge Instructions   None    ED Prescriptions     Medication Sig Dispense Auth. Provider   amoxicillin-clavulanate (AUGMENTIN) 875-125 MG tablet Take 1 tablet by mouth every 12 (twelve) hours. 14 tablet Particia Nearing, [place]   chlorhexidine (PERIDEX) 0.12 % solution Use as directed 15 mLs in the mouth or throat 2 (two) times daily. 120 mL Particia Nearing, PA-C   lidocaine (XYLOCAINE) 2 % solution Use as directed 10 mLs in the mouth or throat every 3 (three) hours as needed. 100 mL Particia Nearing, [place]      PDMP not reviewed this encounter.    Particia Nearing, [place] 07/02/23 1427

## 2023-07-27 ENCOUNTER — Ambulatory Visit: Payer: BC Managed Care – PPO | Admitting: Family Medicine

## 2023-08-19 ENCOUNTER — Other Ambulatory Visit: Payer: Self-pay | Admitting: Family Medicine

## 2023-08-19 NOTE — Telephone Encounter (Signed)
Copied from CRM [id number]. Topic: Clinical - Medication Refill >> Aug 19, 2023  2:24 PM Dennison Nancy wrote: Most Recent Primary Care Visit:  Provider: Tommie Sams  Department: [facility]  Visit Type: PHYSICAL  Date: 04/26/2023  Medication: Ozempic Has the patient contacted their pharmacy? Yes (Agent: If no, request that the patient contact the pharmacy for the refill. If patient does not wish to contact the pharmacy document the reason why and proceed with request.) (Agent: If yes, when and what did the pharmacy advise?)  Is this the correct pharmacy for this prescription? Yes If no, delete pharmacy and type the correct one.  This is the patient's preferred pharmacy:  [id number] - [place], [place] - [address] [place] [address] Phone: [phone] Fax: [phone]   Has the prescription been filled recently? No  Is the patient out of the medication? Yes  Has the patient been seen for an appointment in the last year OR does the patient have an upcoming appointment? Yes  Can we respond through MyChart? Yes  Agent: Please be advised that Rx refills may take up to 3 business days. We ask that you follow-up with your pharmacy.

## 2023-10-08 NOTE — Patient Instructions (Signed)

## 2023-10-11 ENCOUNTER — Encounter: Payer: Self-pay | Admitting: Nurse Practitioner

## 2023-10-11 ENCOUNTER — Ambulatory Visit (INDEPENDENT_AMBULATORY_CARE_PROVIDER_SITE_OTHER): Payer: Commercial Managed Care - PPO | Admitting: Nurse Practitioner

## 2023-10-11 VITALS — BP 106/84 | HR 72 | Ht 69.69 in | Wt 219.6 lb

## 2023-10-11 DIAGNOSIS — Z7984 Long term (current) use of oral hypoglycemic drugs: Secondary | ICD-10-CM

## 2023-10-11 DIAGNOSIS — E782 Mixed hyperlipidemia: Secondary | ICD-10-CM | POA: Diagnosis not present

## 2023-10-11 DIAGNOSIS — E1165 Type 2 diabetes mellitus with hyperglycemia: Secondary | ICD-10-CM

## 2023-10-11 LAB — POCT GLYCOSYLATED HEMOGLOBIN (HGB A1C): Hemoglobin A1C: 6.3 % — AB (ref 4.0–5.6)

## 2023-10-11 NOTE — Progress Notes (Signed)
 Endocrinology Consult Note       10/11/2023, 11:18 AM   Subjective:    Patient ID: Steven Santiago, male    DOB: Oct 17, 1995.  Steven Santiago is being seen in consultation for management of currently uncontrolled symptomatic diabetes requested by  Tommie Sams, DO.   Past Medical History:  Diagnosis Date   Diabetes mellitus without complication (HCC)     History reviewed. No pertinent surgical history.  Social History   Socioeconomic History   Marital status: Married    Spouse name: Not on file   Number of children: Not on file   Years of education: Not on file   Highest education level: Not on file  Occupational History   Not on file  Tobacco Use   Smoking status: Former   Smokeless tobacco: Never  Vaping Use   Vaping status: Every Day   Substances: Nicotine, Flavoring, Nicotine-salt  Substance and Sexual Activity   Alcohol use: Yes    Comment: several times weekly   Drug use: Never   Sexual activity: Not on file  Other Topics Concern   Not on file  Social History Narrative   Not on file   Social Drivers of Health   Financial Resource Strain: Not on file  Food Insecurity: Not on file  Transportation Needs: Not on file  Physical Activity: Not on file  Stress: Not on file  Social Connections: Not on file    Family History  Problem Relation Age of Onset   Breast cancer Mother 25 - 66   Cervical cancer Mother 52 - 16   Thyroid cancer Father 31   Pancreatic cancer Maternal Grandmother 54   Cancer Paternal Great-grandmother        grandmother's mother, unknown type   Cancer Cousin        paternal first cousin, unknown cancer dx. childhood    Outpatient Encounter Medications as of 10/11/2023  Medication Sig   [DISCONTINUED] OZEMPIC, 0.25 OR 0.5 MG/DOSE, 2 MG/3ML SOPN INJECT 0.25MG  INTO THE SKIN ONCE A WEEK FOR 4 WEEKS THEN INCREASE TO 0.5 MG ONCE A WEEK   [DISCONTINUED]  amoxicillin-clavulanate (AUGMENTIN) 875-125 MG tablet Take 1 tablet by mouth every 12 (twelve) hours.   [DISCONTINUED] chlorhexidine (PERIDEX) 0.12 % solution Use as directed 15 mLs in the mouth or throat 2 (two) times daily. (Patient not taking: Reported on 10/11/2023)   [DISCONTINUED] lidocaine (XYLOCAINE) 2 % solution Use as directed 10 mLs in the mouth or throat every 3 (three) hours as needed. (Patient not taking: Reported on 10/11/2023)   No facility-administered encounter medications on file as of 10/11/2023.    ALLERGIES: No Known Allergies  VACCINATION STATUS: Immunization History  Administered Date(s) Administered   Influenza, Seasonal, Injecte, Preservative Fre 04/26/2023   Influenza,inj,Quad PF,6+ Mos 04/16/2022    Diabetes He presents for his initial diabetic visit. He has type 2 diabetes mellitus. Onset time: diagnosed at age 19. His disease course has been improving. Hypoglycemia symptoms include dizziness and nervousness/anxiousness. There are no diabetic associated symptoms. There are no hypoglycemic complications. There are no diabetic complications. Risk factors for coronary artery disease include diabetes mellitus, dyslipidemia, male sex and tobacco exposure. Current diabetic treatments:  Ozempic only. He is compliant with treatment most of the time. His weight is decreasing steadily. He is following a generally healthy diet. When asked about meal planning, he reported none. He has not had a previous visit with a dietitian. He participates in exercise intermittently. (He presents today for his consultation with no meter or logs to review.  He does not monitor glucose at home, does not have a meter.  His POCT A1c today is 6.2%, improving drastically from his last A1c in September of 12.5%.  He notes he drinks mostly water, will have matcha lattes too.  He does intermittent fasting and does not eat on any routine schedule.  He does maintain an active lifestyle, runs about 1.5 miles 3-4  days a week.  He is UTD on eye exam, never seen podiatry in the past.  He notes he was once pretty heavy (over 325 lbs) but started to lose weight prior to his diabetes diagnosis.  He also notes he was a heavy drinker at that time as well.  Since then he has lost a significant amount of weight.) An ACE inhibitor/angiotensin II receptor blocker is not being taken. He does not see a podiatrist.Eye exam is current.     Review of systems  Constitutional: + Minimally fluctuating body weight, current Body mass index is 31.79 kg/m., no fatigue, no subjective hyperthermia, no subjective hypothermia Eyes: no blurry vision, no xerophthalmia ENT: no sore throat, no nodules palpated in throat, no dysphagia/odynophagia, no hoarseness Cardiovascular: no chest pain, no shortness of breath, no palpitations, no leg swelling Respiratory: no cough, no shortness of breath Gastrointestinal: no nausea/vomiting/diarrhea Musculoskeletal: no muscle/joint aches Skin: no rashes, no hyperemia Neurological: no tremors, no numbness, no tingling, no dizziness Psychiatric: no depression, no anxiety  Objective:     BP 106/84 (BP Location: Left Arm, Patient Position: Sitting, Cuff Size: Large)   Pulse 72   Ht 5' 9.69" (1.77 m)   Wt 219 lb 9.6 oz (99.6 kg)   BMI 31.79 kg/m   Wt Readings from Last 3 Encounters:  10/11/23 219 lb 9.6 oz (99.6 kg)  04/26/23 223 lb 6.4 oz (101.3 kg)  03/16/23 223 lb 6.4 oz (101.3 kg)     BP Readings from Last 3 Encounters:  10/11/23 106/84  07/02/23 114/78  04/26/23 123/86     Physical Exam- Limited  Constitutional:  Body mass index is 31.79 kg/m. , not in acute distress, normal state of mind Eyes:  EOMI, no exophthalmos Neck: Supple Cardiovascular: RRR, no murmurs, rubs, or gallops, no edema Respiratory: Adequate breathing efforts, no crackles, rales, rhonchi, or wheezing Musculoskeletal: no gross deformities, strength intact in all four extremities, no gross restriction of  joint movements Skin:  no rashes, no hyperemia Neurological: no tremor with outstretched hands   Diabetic Foot Exam - Simple   No data filed      CMP ( most recent) CMP     Component Value Date/Time   NA 136 04/16/2022 1343   K 4.6 04/16/2022 1343   CL 97 04/16/2022 1343   CO2 23 04/16/2022 1343   GLUCOSE 392 (H) 04/16/2022 1343   BUN 9 04/16/2022 1343   CREATININE 0.84 04/16/2022 1343   CALCIUM 9.9 04/16/2022 1343   PROT 7.4 04/16/2022 1343   ALBUMIN 4.9 04/16/2022 1343   AST 26 04/16/2022 1343   ALT 58 (H) 04/16/2022 1343   ALKPHOS 96 04/16/2022 1343   BILITOT 0.6 04/16/2022 1343   EGFR 122 04/16/2022 1343  Diabetic Labs (most recent): Lab Results  Component Value Date   HGBA1C 6.3 (A) 10/11/2023   HGBA1C 12.5 (H) 03/16/2023   HGBA1C 10.0 (H) 04/16/2022     Lipid Panel ( most recent) Lipid Panel     Component Value Date/Time   CHOL 203 (H) 03/16/2023 1545   TRIG 158 (H) 03/16/2023 1545   HDL 47 03/16/2023 1545   CHOLHDL 4.3 03/16/2023 1545   LDLCALC 128 (H) 03/16/2023 1545   LABVLDL 28 03/16/2023 1545      Lab Results  Component Value Date   TSH 1.010 04/16/2022           Assessment & Plan:   1) Type 2 diabetes mellitus with hyperglycemia, without long-term current use of insulin (HCC) (Primary)  He presents today for his consultation with no meter or logs to review.  He does not monitor glucose at home, does not have a meter.  His POCT A1c today is 6.2%, improving drastically from his last A1c in September of 12.5%.  He notes he drinks mostly water, will have matcha lattes too.  He does intermittent fasting and does not eat on any routine schedule.  He does maintain an active lifestyle, runs about 1.5 miles 3-4 days a week.  He is UTD on eye exam, never seen podiatry in the past.  He notes he was once pretty heavy (over 325 lbs) but started to lose weight prior to his diabetes diagnosis.  He also notes he was a heavy drinker at that time as  well.  Since then he has lost a significant amount of weight.  - Steven Santiago has currently uncontrolled symptomatic type 2 DM since 28 years of age, with most recent A1c of 6.2 %.   -Recent labs reviewed.  - I had a long discussion with him about the progressive nature of diabetes and the pathology behind its complications. -his diabetes is not currently complicated but he remains at a high risk for more acute and chronic complications which include CAD, CVA, CKD, retinopathy, and neuropathy. These are all discussed in detail with him.  The following Lifestyle Medicine recommendations according to American College of Lifestyle Medicine [facility]) were discussed and offered to patient and he agrees to start the journey:  A. Whole Foods, Plant-based plate comprising of fruits and vegetables, plant-based proteins, whole-grain carbohydrates was discussed in detail with the patient.   A list for source of those nutrients were also provided to the patient.  Patient will use only water or unsweetened tea for hydration. B.  The need to stay away from risky substances including alcohol, smoking; obtaining 7 to 9 hours of restorative sleep, at least 150 minutes of moderate intensity exercise weekly, the importance of healthy social connections,  and stress reduction techniques were discussed. C.  A full color page of  Calorie density of various food groups per pound showing examples of each food groups was provided to the patient.  - I have counseled him on diet and weight management by adopting a carbohydrate restricted/protein rich diet. Patient is encouraged to switch to unprocessed or minimally processed complex starch and increased protein intake (animal or plant source), fruits, and vegetables. -  he is advised to stick to a routine mealtimes to eat 3 meals a day and avoid unnecessary snacks (to snack only to correct hypoglycemia).   - he acknowledges that there is a room for improvement in his food and  drink choices. - Suggestion is made for him to avoid simple  carbohydrates from his diet including Cakes, Sweet Desserts, Ice Cream, Soda (diet and regular), Sweet Tea, Candies, Chips, Cookies, Store Bought Juices, Alcohol in Excess of 1-2 drinks a day, Artificial Sweeteners, Coffee Creamer, and "Sugar-free" Products. This will help patient to have more stable blood glucose profile and potentially avoid unintended weight gain.  - I have approached him with the following individualized plan to manage his diabetes and patient agrees:   -I advised him to stop his Ozempic.  He can use up his current supply 0.5 mg SQ every other week until it is depleted.  He is NOT an ideal candidate for GLP1 hormone due to positive family history of MTC (in his dad).  Will try to keep him off medications moving forward and focus more on lifestyle changes instead.  -He does not need to monitor glucose at this time.  - Specific targets for  A1c; LDL, HDL, and Triglycerides were discussed with the patient.  2) Blood Pressure /Hypertension:  his blood pressure is controlled to target without the use of antihypertensive medications.  3) Lipids/Hyperlipidemia:    Review of his recent lipid panel from 03/16/23 showed uncontrolled LDL at 128. He is not on any lipid lowering medications.  4)  Weight/Diet:  his Body mass index is 31.79 kg/m.  -  clearly complicating his diabetes care.   he is a candidate for weight loss. I discussed with him the fact that loss of 5 - 10% of his  current body weight will have the most impact on his diabetes management.  Exercise, and detailed carbohydrates information provided  -  detailed on discharge instructions.  5) Chronic Care/Health Maintenance: -he is not on ACEI/ARB or Statin medications and is encouraged to initiate and continue to follow up with Ophthalmology, Dentist, Podiatrist at least yearly or according to recommendations, and advised to stay away from smoking. I have  recommended yearly flu vaccine and pneumonia vaccine at least every 5 years; moderate intensity exercise for up to 150 minutes weekly; and sleep for at least 7 hours a day.  - he is advised to maintain close follow up with Tommie Sams, DO for primary care needs, as well as his other providers for optimal and coordinated care.   - Time spent in this patient care: 60 min, of which > 50% was spent in counseling him about his diabetes and the rest reviewing his blood glucose logs, discussing his hypoglycemia and hyperglycemia episodes, reviewing his current and previous labs/studies (including abstraction from other facilities) and medications doses and developing a long term treatment plan based on the latest standards of care/guidelines; and documenting his care.    Please refer to Patient Instructions for Blood Glucose Monitoring and Insulin/Medications Dosing Guide" in media tab for additional information. Please also refer to "Patient Self Inventory" in the Media tab for reviewed elements of pertinent patient history.  Steven Santiago participated in the discussions, expressed understanding, and voiced agreement with the above plans.  All questions were answered to his satisfaction. he is encouraged to contact clinic should he have any questions or concerns prior to his return visit.     Follow up plan: - Return in about 4 months (around 02/10/2024) for Diabetes F/U with A1c in office, No previsit labs.    Ronny Bacon, [facility] [address] [place], [place] [address] Phone: [phone] Fax: [phone]  10/11/2023, 11:18 AM

## 2024-01-20 ENCOUNTER — Ambulatory Visit: Admitting: Family Medicine

## 2024-01-20 DIAGNOSIS — G8929 Other chronic pain: Secondary | ICD-10-CM

## 2024-01-20 DIAGNOSIS — K644 Residual hemorrhoidal skin tags: Secondary | ICD-10-CM

## 2024-01-20 DIAGNOSIS — R0982 Postnasal drip: Secondary | ICD-10-CM

## 2024-01-20 DIAGNOSIS — E119 Type 2 diabetes mellitus without complications: Secondary | ICD-10-CM | POA: Diagnosis not present

## 2024-01-20 DIAGNOSIS — E7800 Pure hypercholesterolemia, unspecified: Secondary | ICD-10-CM

## 2024-01-20 DIAGNOSIS — M25561 Pain in right knee: Secondary | ICD-10-CM | POA: Diagnosis not present

## 2024-01-20 MED ORDER — FLUTICASONE PROPIONATE 50 MCG/ACT NA SUSP: 2.0000 | Freq: Every day | NASAL | 6 refills | Status: AC

## 2024-01-20 MED ORDER — HYDROCORTISONE (PERIANAL) 2.5 % EX CREA: 1.0000 | TOPICAL_CREAM | Freq: Two times a day (BID) | CUTANEOUS | 3 refills | Status: AC

## 2024-01-20 MED ORDER — CETIRIZINE HCL 10 MG PO TABS: 10.0000 mg | ORAL_TABLET | Freq: Every day | ORAL | 11 refills | Status: AC

## 2024-01-20 NOTE — Patient Instructions (Signed)
 Medications as prescribed.  Xray at the hospital.  Labs at your convenience.  Follow up in in 3-6 months.

## 2024-01-20 NOTE — Progress Notes (Signed)
 Subjective:  Patient ID: Steven Santiago, male    DOB: 06-20-1996  Age: 28 y.o. MRN: [id number]  CC:   Chief Complaint  Patient presents with   discomfort in throat    cobblestone   burning sensation in right knee     Triggered by jogging or moving or touching, no injury    Hemorrhoids    Cream tried    HPI:  28 year old male presents for evaluation of the above.  Patient has 3 separate concerns today. Postnasal drip This has been an ongoing intermittent issue.  Recent return of symptoms.  Reports cobblestoning in the back of his throat.  He has some throat discomfort. Right knee pain Patient describes a burning sensation underneath his patella.  Started after he was on his hands and knees several months ago.  He states that it really started in February.  No falls or trauma. No swelling.  Hemorrhoids Patient reports a longstanding history of external hemorrhoids.  He states that this has been ongoing for years.  He has not seek care due to embarrassment.  He is currently using over-the-counter hemorrhoid cream.  He reports some discomfort and itching.  In addition to his above concerns, he needs a labs.  His A1c is now at goal.  Patient Active Problem List   Diagnosis Date Noted   Type 2 diabetes mellitus without complication, without long-term current use of insulin (HCC) 01/20/2024   Postnasal drip 01/20/2024   External hemorrhoids 01/20/2024   Chronic pain of right knee 01/20/2024   Hyperlipidemia 03/16/2023   Annual physical exam 04/16/2022    Social Hx   Social History   Socioeconomic History   Marital status: Married    Spouse name: Not on file   Number of children: Not on file   Years of education: Not on file   Highest education level: Not on file  Occupational History   Not on file  Tobacco Use   Smoking status: Former   Smokeless tobacco: Never  Vaping Use   Vaping status: Every Day   Substances: Nicotine, Flavoring, Nicotine-salt  Substance and  Sexual Activity   Alcohol use: Yes    Comment: several times weekly   Drug use: Never   Sexual activity: Not on file  Other Topics Concern   Not on file  Social History Narrative   Not on file   Social Drivers of Health   Financial Resource Strain: Not on file  Food Insecurity: Not on file  Transportation Needs: Not on file  Physical Activity: Not on file  Stress: Not on file  Social Connections: Not on file    Review of Systems Per HPI  Objective:  BP 118/88   Pulse 77   Temp (!) 96.8 F (36 C)   Ht 5' 9.69 (1.77 m)   Wt 213 lb (96.6 kg)   SpO2 97%   BMI 30.83 kg/m      01/20/2024    8:16 AM 10/11/2023    9:29 AM 07/02/2023    1:39 PM  BP/Weight  Systolic BP 118 106 114  Diastolic BP 88 84 78  Wt. (Lbs) 213 219.6   BMI 30.83 kg/m2 31.79 kg/m2     Physical Exam Vitals and nursing note reviewed.  Constitutional:      General: He is not in acute distress.    Appearance: Normal appearance. He is obese.  HENT:     Head: Normocephalic and atraumatic.     Mouth/Throat:     Comments:  Cobblestoning noted. Pulmonary:     Effort: Pulmonary effort is normal. No respiratory distress.  Genitourinary:    Comments: External hemorrhoids noted. Musculoskeletal:     Comments: Right knee -no appreciable effusion.  Ligaments intact.  Patient endorsing tenderness over the lateral joint line.  Neurological:     Mental Status: He is alert.     Lab Results  Component Value Date   WBC 7.0 04/16/2022   HGB 16.4 04/16/2022   HCT 46.8 04/16/2022   PLT 343 04/16/2022   GLUCOSE 392 (H) 04/16/2022   CHOL 203 (H) 03/16/2023   TRIG 158 (H) 03/16/2023   HDL 47 03/16/2023   LDLCALC 128 (H) 03/16/2023   ALT 58 (H) 04/16/2022   AST 26 04/16/2022   NA 136 04/16/2022   K 4.6 04/16/2022   CL 97 04/16/2022   CREATININE 0.84 04/16/2022   BUN 9 04/16/2022   CO2 23 04/16/2022   TSH 1.010 04/16/2022   HGBA1C 6.3 (A) 10/11/2023     Assessment & Plan:  Pure  hypercholesterolemia Assessment & Plan: Lipid panel today to assess.  Orders: -     Lipid panel  Type 2 diabetes mellitus without complication, without long-term current use of insulin (HCC) Assessment & Plan: A1c has dramatically proved with lifestyle change.  A1c and urine microalbumin today.  Orders: -     Hemoglobin A1c -     Microalbumin / creatinine urine ratio  Chronic pain of right knee Assessment & Plan: X-ray for further evaluation.  Orders: -     DG Knee Complete 4 Views Right  Postnasal drip Assessment & Plan: Treating with Zyrtec  and Flonase .  Orders: -     Fluticasone  Propionate; Place 2 sprays into both nostrils daily.  Dispense: 16 g; Refill: 6 -     Cetirizine  HCl; Take 1 tablet (10 mg total) by mouth daily.  Dispense: 30 tablet; Refill: 11  External hemorrhoids Assessment & Plan: Treating with Anusol .  Orders: -     Hydrocortisone  (Perianal); Place 1 Application rectally 2 (two) times daily.  Dispense: 30 g; Refill: 3    Follow-up: 3 to 6 months  Threasa Kinch Bluford DO [facility]

## 2024-01-20 NOTE — Assessment & Plan Note (Signed)
Treating with Anusol. 

## 2024-01-20 NOTE — Assessment & Plan Note (Signed)
X-ray for further evaluation.

## 2024-01-20 NOTE — Assessment & Plan Note (Signed)
 A1c has dramatically proved with lifestyle change.  A1c and urine microalbumin today.

## 2024-01-20 NOTE — Assessment & Plan Note (Signed)
Treating with Zyrtec and Flonase.

## 2024-01-20 NOTE — Assessment & Plan Note (Signed)
Lipid panel today to assess. 

## 2024-02-11 ENCOUNTER — Ambulatory Visit: Admitting: Nurse Practitioner

## 2024-02-11 ENCOUNTER — Encounter: Payer: Self-pay | Admitting: Nurse Practitioner

## 2024-02-11 VITALS — BP 118/84 | HR 74 | Ht 69.69 in | Wt 213.0 lb

## 2024-02-11 DIAGNOSIS — E1165 Type 2 diabetes mellitus with hyperglycemia: Secondary | ICD-10-CM | POA: Diagnosis not present

## 2024-02-11 DIAGNOSIS — Z7984 Long term (current) use of oral hypoglycemic drugs: Secondary | ICD-10-CM | POA: Diagnosis not present

## 2024-02-11 DIAGNOSIS — R1012 Left upper quadrant pain: Secondary | ICD-10-CM

## 2024-02-11 DIAGNOSIS — E782 Mixed hyperlipidemia: Secondary | ICD-10-CM

## 2024-02-11 LAB — HEMOGLOBIN A1C: Hemoglobin A1C: 8.9

## 2024-02-11 NOTE — Progress Notes (Signed)
 Endocrinology Follow Up Note       02/11/2024, 10:02 AM   Subjective:    Patient ID: Steven Santiago, male    DOB: 10/04/1995.  Steven Santiago is being seen in follow up after being seen in consultation for management of currently uncontrolled symptomatic diabetes requested by  Cook, Jayce G, DO.   Past Medical History:  Diagnosis Date   Diabetes mellitus without complication (HCC)     History reviewed. No pertinent surgical history.  Social History   Socioeconomic History   Marital status: Married    Spouse name: Not on file   Number of children: Not on file   Years of education: Not on file   Highest education level: Not on file  Occupational History   Not on file  Tobacco Use   Smoking status: Former   Smokeless tobacco: Never  Vaping Use   Vaping status: Every Day   Substances: Nicotine, Flavoring, Nicotine-salt  Substance and Sexual Activity   Alcohol use: Yes    Comment: several times weekly   Drug use: Never   Sexual activity: Not on file  Other Topics Concern   Not on file  Social History Narrative   Not on file   Social Drivers of Health   Financial Resource Strain: Not on file  Food Insecurity: Not on file  Transportation Needs: Not on file  Physical Activity: Not on file  Stress: Not on file  Social Connections: Not on file    Family History  Problem Relation Age of Onset   Breast cancer Mother 76 - 79   Cervical cancer Mother 52 - 36   Thyroid cancer Father 45   Pancreatic cancer Maternal Grandmother 45   Cancer Paternal Great-grandmother        grandmother's mother, unknown type   Cancer Cousin        paternal first cousin, unknown cancer dx. childhood    Outpatient Encounter Medications as of 02/11/2024  Medication Sig   cetirizine  (ZYRTEC ) 10 MG tablet Take 1 tablet (10 mg total) by mouth daily.   fluticasone  (FLONASE ) 50 MCG/ACT nasal spray Place 2 sprays into both  nostrils daily.   hydrocortisone  (ANUSOL -HC) 2.5 % rectal cream Place 1 Application rectally 2 (two) times daily.   No facility-administered encounter medications on file as of 02/11/2024.    ALLERGIES: No Known Allergies  VACCINATION STATUS: Immunization History  Administered Date(s) Administered   Influenza, Seasonal, Injecte, Preservative Fre 04/26/2023   Influenza,inj,Quad PF,6+ Mos 04/16/2022    Diabetes He presents for his follow-up diabetic visit. He has type 2 diabetes mellitus. Onset time: diagnosed at age 28. There are no hypoglycemic associated symptoms. There are no diabetic associated symptoms. There are no hypoglycemic complications. There are no diabetic complications. Risk factors for coronary artery disease include diabetes mellitus, dyslipidemia, male sex and tobacco exposure. Current diabetic treatment includes diet. He is compliant with treatment some of the time. His weight is decreasing steadily. He is following a generally healthy diet. When asked about meal planning, he reported none. He has not had a previous visit with a dietitian. He participates in exercise intermittently. (He presents today with no meter or logs to review.  He was not asked to routinely monitor glucose at home as he was taken off Ozempic  between visit.  His POCT A1c today is 8.9%, increasing from last A1c of 6.3%.  He admits he has been eating more processed foods lately and sweets.  He also notes left upper quadrant abdominal pain going on for the past 2 weeks, getting worse.  He does have history of constipation and hemorrhoids.  Will order abdominal ultrasound to assess (?gallbladder).) An ACE inhibitor/angiotensin II receptor blocker is not being taken. He does not see a podiatrist.Eye exam is current.     Review of systems  Constitutional: + Minimally fluctuating body weight, current Body mass index is 30.83 kg/m., no fatigue, no subjective hyperthermia, no subjective hypothermia Eyes: no  blurry vision, no xerophthalmia ENT: no sore throat, no nodules palpated in throat, no dysphagia/odynophagia, no hoarseness Cardiovascular: no chest pain, no shortness of breath, no palpitations, no leg swelling Respiratory: no cough, no shortness of breath Gastrointestinal: no nausea/vomiting/diarrhea, + intermittent constipation- irregular bowel movements, + hemorrhoids, left upper quadrant, epigastric pain Musculoskeletal: no muscle/joint aches Skin: no rashes, no hyperemia Neurological: no tremors, no numbness, no tingling, no dizziness Psychiatric: no depression, no anxiety  Objective:     BP 118/84 (BP Location: Right Arm, Patient Position: Sitting, Cuff Size: Large)   Pulse 74   Ht 5' 9.69 (1.77 m)   Wt 213 lb (96.6 kg)   BMI 30.83 kg/m   Wt Readings from Last 3 Encounters:  02/11/24 213 lb (96.6 kg)  01/20/24 213 lb (96.6 kg)  10/11/23 219 lb 9.6 oz (99.6 kg)     BP Readings from Last 3 Encounters:  02/11/24 118/84  01/20/24 118/88  10/11/23 106/84      Physical Exam- Limited  Constitutional:  Body mass index is 30.83 kg/m. , not in acute distress, normal state of mind Eyes:  EOMI, no exophthalmos Musculoskeletal: no gross deformities, strength intact in all four extremities, no gross restriction of joint movements Skin:  no rashes, no hyperemia Neurological: no tremor with outstretched hands   Diabetic Foot Exam - Simple   No data filed      CMP ( most recent) CMP     Component Value Date/Time   NA 136 04/16/2022 1343   K 4.6 04/16/2022 1343   CL 97 04/16/2022 1343   CO2 23 04/16/2022 1343   GLUCOSE 392 (H) 04/16/2022 1343   BUN 9 04/16/2022 1343   CREATININE 0.84 04/16/2022 1343   CALCIUM 9.9 04/16/2022 1343   PROT 7.4 04/16/2022 1343   ALBUMIN 4.9 04/16/2022 1343   AST 26 04/16/2022 1343   ALT 58 (H) 04/16/2022 1343   ALKPHOS 96 04/16/2022 1343   BILITOT 0.6 04/16/2022 1343   EGFR 122 04/16/2022 1343     Diabetic Labs (most  recent): Lab Results  Component Value Date   HGBA1C 6.3 (A) 10/11/2023   HGBA1C 12.5 (H) 03/16/2023   HGBA1C 10.0 (H) 04/16/2022     Lipid Panel ( most recent) Lipid Panel     Component Value Date/Time   CHOL 203 (H) 03/16/2023 1545   TRIG 158 (H) 03/16/2023 1545   HDL 47 03/16/2023 1545   CHOLHDL 4.3 03/16/2023 1545   LDLCALC 128 (H) 03/16/2023 1545   LABVLDL 28 03/16/2023 1545      Lab Results  Component Value Date   TSH 1.010 04/16/2022           Assessment & Plan:   1) Type 2 diabetes mellitus  with hyperglycemia, without long-term current use of insulin (HCC) (Primary)  He presents today with no meter or logs to review.  He was not asked to routinely monitor glucose at home as he was taken off Ozempic  between visit.  His POCT A1c today is 8.9%, increasing from last A1c of 6.3%.  He admits he has been eating more processed foods lately and sweets.  He also notes left upper quadrant abdominal pain going on for the past 2 weeks, getting worse.  He does have history of constipation and hemorrhoids.  Will order abdominal ultrasound to assess (?gallbladder).  - Farrell Nafziger has currently uncontrolled symptomatic type 2 DM since 28 years of age.   -Recent labs reviewed.  - I had a long discussion with him about the progressive nature of diabetes and the pathology behind its complications. -his diabetes is not currently complicated but he remains at a high risk for more acute and chronic complications which include CAD, CVA, CKD, retinopathy, and neuropathy. These are all discussed in detail with him.  The following Lifestyle Medicine recommendations according to [employer] [facility]) were discussed and offered to patient and he agrees to start the journey:  A. Whole Foods, Plant-based plate comprising of fruits and vegetables, plant-based proteins, whole-grain carbohydrates was discussed in detail with the patient.   A list for source of those  nutrients were also provided to the patient.  Patient will use only water or unsweetened tea for hydration. B.  The need to stay away from risky substances including alcohol, smoking; obtaining 7 to 9 hours of restorative sleep, at least 150 minutes of moderate intensity exercise weekly, the importance of healthy social connections,  and stress reduction techniques were discussed. C.  A full color page of  Calorie density of various food groups per pound showing examples of each food groups was provided to the patient.  - Nutritional counseling repeated at each appointment due to patients tendency to fall back in to old habits.  - The patient admits there is a room for improvement in their diet and drink choices. -  Suggestion is made for the patient to avoid simple carbohydrates from their diet including Cakes, Sweet Desserts / Pastries, Ice Cream, Soda (diet and regular), Sweet Tea, Candies, Chips, Cookies, Sweet Pastries, Store Bought Juices, Alcohol in Excess of 1-2 drinks a day, Artificial Sweeteners, Coffee Creamer, and Sugar-free Products. This will help patient to have stable blood glucose profile and potentially avoid unintended weight gain.   - I encouraged the patient to switch to unprocessed or minimally processed complex starch and increased protein intake (animal or plant source), fruits, and vegetables.   - Patient is advised to stick to a routine mealtimes to eat 3 meals a day and avoid unnecessary snacks (to snack only to correct hypoglycemia).  - I have approached him with the following individualized plan to manage his diabetes and patient agrees:   -He can stay off medication at this time.  Will work hard on lifestyle modifications to avoid needing medication in the future.  -He does not need to monitor glucose at this time.  I did recommend checking glucose 1-2 times a week to keep a closer eye on glucose and to reach out if glucose above 200 consistently.  - Specific  targets for  A1c; LDL, HDL, and Triglycerides were discussed with the patient.  2) Blood Pressure /Hypertension:  his blood pressure is controlled to target without the use of antihypertensive medications.  3)  Lipids/Hyperlipidemia:    Review of his recent lipid panel from 03/16/23 showed uncontrolled LDL at 128. He is not on any lipid lowering medications.  4)  Weight/Diet:  his Body mass index is 30.83 kg/m.  -  clearly complicating his diabetes care.   he is a candidate for weight loss. I discussed with him the fact that loss of 5 - 10% of his  current body weight will have the most impact on his diabetes management.  Exercise, and detailed carbohydrates information provided  -  detailed on discharge instructions.  5) Chronic Care/Health Maintenance: -he is not on ACEI/ARB or Statin medications and is encouraged to initiate and continue to follow up with Ophthalmology, Dentist, Podiatrist at least yearly or according to recommendations, and advised to stay away from smoking. I have recommended yearly flu vaccine and pneumonia vaccine at least every 5 years; moderate intensity exercise for up to 150 minutes weekly; and sleep for at least 7 hours a day.  - he is advised to maintain close follow up with Cook, Jayce G, DO for primary care needs, as well as his other providers for optimal and coordinated care.     I spent  32  minutes in the care of the patient today including review of labs from CMP, Lipids, Thyroid Function, Hematology (current and previous including abstractions from other facilities); face-to-face time discussing  his blood glucose readings/logs, discussing hypoglycemia and hyperglycemia episodes and symptoms, medications doses, his options of short and long term treatment based on the latest standards of care / guidelines;  discussion about incorporating lifestyle medicine;  and documenting the encounter. Risk reduction counseling performed per USPSTF guidelines to reduce  obesity and cardiovascular risk factors.     Please refer to Patient Instructions for Blood Glucose Monitoring and Insulin/Medications Dosing Guide  in media tab for additional information. Please  also refer to  Patient Self Inventory in the Media  tab for reviewed elements of pertinent patient history.  Proctor Osterman participated in the discussions, expressed understanding, and voiced agreement with the above plans.  All questions were answered to his satisfaction. he is encouraged to contact clinic should he have any questions or concerns prior to his return visit.     Follow up plan: - Return in about 3 months (around 05/13/2024) for Diabetes F/U with A1c in office.    Benton Rio, [facility] [address] [place], [place] [address] Phone: [phone] Fax: [phone]  02/11/2024, 10:02 AM

## 2024-02-15 ENCOUNTER — Encounter: Payer: Self-pay | Admitting: Nurse Practitioner

## 2024-02-18 ENCOUNTER — Ambulatory Visit (HOSPITAL_COMMUNITY)
Admission: RE | Admit: 2024-02-18 | Discharge: 2024-02-18 | Disposition: A | Discharge status: Home / Self Care | Source: Ambulatory Visit | Attending: Nurse Practitioner | Admitting: Nurse Practitioner

## 2024-02-18 DIAGNOSIS — R1012 Left upper quadrant pain: Secondary | ICD-10-CM | POA: Diagnosis present

## 2024-04-20 ENCOUNTER — Ambulatory Visit: Admitting: Family Medicine

## 2024-05-08 ENCOUNTER — Ambulatory Visit: Admitting: Family Medicine

## 2024-05-18 ENCOUNTER — Encounter: Admitting: Nurse Practitioner

## 2024-05-19 NOTE — Progress Notes (Signed)
 Erroneous encounter

## 2024-06-09 ENCOUNTER — Ambulatory Visit: Admitting: Family Medicine
